# Patient Record
Sex: Male | Born: 1975 | Race: White | Hispanic: No | Marital: Married | Smoking: Never smoker
Health system: Southern US, Community
[De-identification: ages and names within clinical notes are randomized; demographics above are authoritative.]

---

## 2017-03-20 ENCOUNTER — Ambulatory Visit (INDEPENDENT_AMBULATORY_CARE_PROVIDER_SITE_OTHER): Payer: 59

## 2017-03-20 ENCOUNTER — Ambulatory Visit (INDEPENDENT_AMBULATORY_CARE_PROVIDER_SITE_OTHER): Payer: 59 | Admitting: Sports Medicine

## 2017-03-20 DIAGNOSIS — S93331A Other subluxation of right foot, initial encounter: Secondary | ICD-10-CM | POA: Insufficient documentation

## 2017-03-20 DIAGNOSIS — S9304XS Dislocation of right ankle joint, sequela: Secondary | ICD-10-CM

## 2017-03-20 DIAGNOSIS — S9304XA Dislocation of right ankle joint, initial encounter: Secondary | ICD-10-CM | POA: Insufficient documentation

## 2017-03-20 DIAGNOSIS — X501XXS Overexertion from prolonged static or awkward postures, sequela: Secondary | ICD-10-CM | POA: Diagnosis not present

## 2017-03-20 DIAGNOSIS — IMO0001 Reserved for inherently not codable concepts without codable children: Secondary | ICD-10-CM

## 2017-03-20 DIAGNOSIS — M25571 Pain in right ankle and joints of right foot: Secondary | ICD-10-CM

## 2017-03-20 NOTE — Assessment & Plan Note (Signed)
Initial injury occurred 3 months ago, recurrent reinjuries. Peroneal tendon sheath injection, CAM boot. X-rays. We will start with 2 weeks, followed by air cast immobilization, if doing well. If he still has pain after 2 weeks we will do a full month of boot immobilization. After the boot immobilization I will start him on peroneal tendon rehabilitation exercises. Return to see me in 2 weeks.

## 2017-03-20 NOTE — Progress Notes (Signed)
   Subjective:    I'm seeing this patient as a consultation for:  Dr. Simmie Daviesicky Stugart  CC: Chronic right ankle pain  HPI: 3 months ago this pleasant 41 year old male inverted his right ankle, he had immediate pain, swelling, inability to bear weight. He really never got it evaluated, since then he's had persistent ankle instability with occasional popping sensations felt laterally, persistent swelling. Moderate, persistent.  Past medical history, Surgical history, Family history not pertinant except as noted below, Social history, Allergies, and medications have been entered into the medical record, reviewed, and no changes needed.   Review of Systems: No headache, visual changes, nausea, vomiting, diarrhea, constipation, dizziness, abdominal pain, skin rash, fevers, chills, night sweats, weight loss, swollen lymph nodes, body aches, joint swelling, muscle aches, chest pain, shortness of breath, mood changes, visual or auditory hallucinations.   Objective:   General: Well Developed, well nourished, and in no acute distress.  Neuro:  Extra-ocular muscles intact, able to move all 4 extremities, sensation grossly intact.  Deep tendon reflexes tested were normal. Psych: Alert and oriented, mood congruent with affect. ENT:  Ears and nose appear unremarkable.  Hearing grossly normal. Neck: Unremarkable overall appearance, trachea midline.  No visible thyroid enlargement. Eyes: Conjunctivae and lids appear unremarkable.  Pupils equal and round. Skin: Warm and dry, no rashes noted.  Cardiovascular: Pulses palpable, no extremity edema. Right Ankle: Visibly swollen mostly behind the lateral malleolus Range of motion is full in all directions. Strength is 5/5 in all directions. Stable lateral and medial ligaments; squeeze test and kleiger test unremarkable; Talar dome nontender; No pain at base of 5th MT; No tenderness over cuboid; No tenderness over N spot or navicular prominence No tenderness on  posterior aspects of lateral and medial malleolus Palpable peroneal tendon subluxation Negative tarsal tunnel tinel's Able to walk 4 steps.  Procedure: Real-time Ultrasound Guided Injection of right peroneal tendon sheath Device: GE Logiq E  Verbal informed consent obtained.  Time-out conducted.  Noted no overlying erythema, induration, or other signs of local infection.  Skin prepped in a sterile fashion.  Local anesthesia: Topical Ethyl chloride.  With sterile technique and under real time ultrasound guidance:  Noted tearing of the peroneus brevis and severe synovitis of the tendon sheath. 25-gauge needle advanced into the tendon sheath and taking care to avoid intratendinous injection I injected 1 mL kenalog 40, 1 mL lidocaine, 1 mL bupivacaine Completed without difficulty  Pain immediately resolved suggesting accurate placement of the medication.  Advised to call if fevers/chills, erythema, induration, drainage, or persistent bleeding.  Images permanently stored and available for review in the ultrasound unit.  Impression: Technically successful ultrasound guided injection.  Impression and Recommendations:   This case required medical decision making of moderate complexity.  Subluxation of peroneal tendon of right foot Initial injury occurred 3 months ago, recurrent reinjuries. Peroneal tendon sheath injection, CAM boot. X-rays. We will start with 2 weeks, followed by air cast immobilization, if doing well. If he still has pain after 2 weeks we will do a full month of boot immobilization. After the boot immobilization I will start him on peroneal tendon rehabilitation exercises. Return to see me in 2 weeks.  ___________________________________________ Ihor Austinhomas J. Benjamin Stainhekkekandam, M.D., ABFM., CAQSM. Primary Care and Sports Medicine [facility]  Adjunct Instructor of Family Medicine  [employer]

## 2017-03-20 NOTE — Patient Instructions (Addendum)
Noted tearing of the peroneus brevis and severe synovitis of the tendon sheath. 25-gauge needle advanced into the tendon sheath and taking care to avoid intratendinous injection I injected 1 mL kenalog 40, 1 mL lidocaine, 1 mL bupivacaine  Peroneal Tendon Subluxation and Dislocation The peroneal tendons are strong bands of tissue that connect a bone in your foot to the muscles that let you turn your feet outward and stand on your toes (peroneal muscles). The tendons pass under the ankle and into a groove of bone. Peroneal tendon subluxation and dislocation are injuries that happen when the peroneal tendons move out of this groove. The injuries can make your ankle weak and can make your ankle and foot hurt. What are the causes? This condition may be caused by:  A severe ankle sprain.  Wear and tear from repeated ankle stress (overuse).  What increases the risk? This condition is more likely to develop in people who play sports in which there is a risk of spraining an ankle or having the foot be forcefully pushed up and in. These sports include:  Skiing.  Hockey.  Ice Education officer, environmental.  Soccer.  Basketball.  Gymnastics.  What are the signs or symptoms? Symptoms of this condition may start suddenly or develop gradually. Symptoms include:  Pain in the back of the ankle.  A snapping or popping feeling or sound.  Swelling in the ankle.  Bruising in the ankle.  A weak and wobbly ankle or a feeling that your ankle is giving way (ankle instability).  Tenderness in the ankle.  How is this diagnosed? This condition may be diagnosed based on:  Your symptoms.  Your medical history.  A physical exam.  Tests, such as: ? An X-ray or CT scan. These may be done to check your ankle bone. ? MRI or ultrasound. These may be done to check your tendon.  How is this treated? Treatment for a subluxation injury may include:  Keeping your body weight off of your ankle for up to 6  weeks. This treatment involves using a device to help you walk, such as crutches or a walker.  Wearing a boot or cast to support your ankle and keep it still.  Applying ice to the ankle to reduce swelling.  Taking anti-inflammatory pain medicine.  Having medicines injected into your tendon to reduce swelling.  Doing exercises (physical therapy).  Returning gradually to full activity.  Surgery. This may be needed if the injury comes back or does not get better after 3-6 months.  Treatment for a dislocation injury usually involves surgery:  To repair any tears in the tissue that holds your tendons in place (retinaculum).  To create a groove behind the ankle.  To secure the tendons in place with sutures, screws, or pins.  Follow these instructions at home: If you have a boot:  Wear it as told by your health care provider. Remove it only as told by your health care provider.  Loosen the boot if your toes tingle, become numb, or turn cold and blue.  If the boot is not waterproof: ? Do not let it get wet. ? Cover it with a watertight covering when you take a bath or a shower.  Keep the boot clean. If you have a cast:  Do not put pressure on any part of the cast or splint until it is fully hardened. This may take several hours.  Do not stick anything inside the cast to scratch your skin. Doing that increases  your risk of infection.  Check the skin around the cast every day. Tell your health care provider about any concerns.  You may put lotion on dry skin around the edges of the cast. Do not put lotion on the skin underneath the cast.  If the cast is not waterproof: ? Do not let it get wet. ? Cover it with a watertight covering when you take a bath or a shower.  Keep the cast clean. Managing pain, stiffness, and swelling  Take over-the-counter and prescription medicines only as told by your health care provider.  If directed, apply ice to the injured area: ? Put ice  in a plastic bag. ? Place a towel between your skin and the bag. ? Leave the ice on for 20 minutes, 2-3 times a day.  Move your toes often to avoid stiffness and to lessen swelling. Activity  Return to your normal activities as told by your health care provider. Ask your health care provider what activities are safe for you.  Do not use your leg to support (bear) all of your body weight until your health care provider says that you can. Use crutches or a walker as told by your health care provider.  Do not do any activities that make pain or swelling worse.  Do exercises as told by your health care provider. Driving  Do not drive or operate heavy machinery while taking prescription pain medicine.  Ask your health care provider when it is safe to drive if you have a cast or boot on your leg. General instructions  Do not use any tobacco products, such as cigarettes, chewing tobacco, and e-cigarettes. Tobacco can delay bone healing. If you need help quitting, ask your health care provider.  Keep all follow-up visits as told by your health care provider. This is important. How is this prevented?  Wear supportive footwear that is appropriate for your athletic activity.  Avoid athletic activities that cause pain or swelling in your ankle or foot.  See your health care provider if you have pain or swelling that does not improve after a few days of rest.  Stop training if you develop pain or swelling.  If you start a new athletic activity, start gradually so you can build up your strength and flexibility. Contact a health care provider if:  Your symptoms get worse.  Your symptoms do not improve in 2-4 weeks.  You develop new, unexplained symptoms.  Your splint, boot, or cast becomes loose or damaged. This information is not intended to replace advice given to you by your health care provider. Make sure you discuss any questions you have with your health care provider. Document  Released: 06/25/2005 Document Revised: 02/28/2016 Document Reviewed: 05/14/2015 Elsevier Interactive Patient Education  [employer]2018 Elsevier Inc.

## 2017-04-04 ENCOUNTER — Encounter: Payer: Self-pay | Admitting: Sports Medicine

## 2017-04-04 ENCOUNTER — Ambulatory Visit (INDEPENDENT_AMBULATORY_CARE_PROVIDER_SITE_OTHER): Payer: 59 | Admitting: Sports Medicine

## 2017-04-04 DIAGNOSIS — S9304XD Dislocation of right ankle joint, subsequent encounter: Secondary | ICD-10-CM | POA: Diagnosis not present

## 2017-04-04 DIAGNOSIS — IMO0001 Reserved for inherently not codable concepts without codable children: Secondary | ICD-10-CM

## 2017-04-04 DIAGNOSIS — S9304XS Dislocation of right ankle joint, sequela: Secondary | ICD-10-CM

## 2017-04-04 NOTE — Assessment & Plan Note (Signed)
I do think there was some improvement after the peroneal tendon sheath injection, swelling is better. No pain. He does feel as though the ankle is unstable. Strap with compressive dressing, 2 more weeks in the boot, followed by formal physical therapy.

## 2017-04-04 NOTE — Progress Notes (Signed)
  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 He still feels instability, has been in the boot for 2 weeks now.  Past medical history:  Negative.  See flowsheet/record as well for more information.  Surgical history: Negative.  See flowsheet/record as well for more information.  Family history: Negative.  See flowsheet/record as well for more information.  Social history: Negative.  See flowsheet/record as well for more information.  Allergies, and medications have been entered into the medical record, reviewed, and no changes needed.   Review of Systems: No fevers, chills, night sweats, weight loss, chest pain, or shortness of breath.   Objective:    General: Well Developed, well nourished, and in no acute distress.  Neuro: Alert and oriented x3, extra-ocular muscles intact, sensation grossly intact.  HEENT: Normocephalic, atraumatic, pupils equal round reactive to light, neck supple, no masses, no lymphadenopathy, thyroid nonpalpable.  Skin: Warm and dry, no rashes. Cardiac: Regular rate and rhythm, no murmurs rubs or gallops, no lower extremity edema.  Respiratory: Clear to auscultation bilaterally. Not using accessory muscles, speaking in full sentences. Right Ankle: No visible erythema or swelling. Range of motion is full in all directions. Strength is 5/5 in all directions. Stable lateral and medial ligaments; squeeze test and kleiger test unremarkable; Talar dome nontender; No pain at base of 5th MT; No tenderness over cuboid; No tenderness over N spot or navicular prominence No tenderness on posterior aspects of lateral and  medial malleolus No sign of peroneal tendon subluxations; he is tender over his peroneus longus just proximal to the base of the fifth metatarsal Negative tarsal tunnel tinel's Able to walk 4 steps.  Strap with compressive dressing  Impression and Recommendations:    Subluxation of peroneal tendon of right foot I do think there was some improvement after the peroneal tendon sheath injection, swelling is better. No pain. He does feel as though the ankle is unstable. Strap with compressive dressing, 2 more weeks in the boot, followed by formal physical therapy.  ___________________________________________ Ihor Austin. Benjamin Stain, M.D., ABFM., CAQSM. Primary Care and Sports Medicine [facility]  Adjunct Instructor of Family Medicine  [employer]

## 2017-04-22 ENCOUNTER — Encounter: Payer: Self-pay | Admitting: Rehabilitation

## 2017-04-22 ENCOUNTER — Encounter: Payer: Self-pay | Admitting: Sports Medicine

## 2017-04-22 ENCOUNTER — Ambulatory Visit (INDEPENDENT_AMBULATORY_CARE_PROVIDER_SITE_OTHER): Payer: 59 | Admitting: Rehabilitation

## 2017-04-22 ENCOUNTER — Encounter (INDEPENDENT_AMBULATORY_CARE_PROVIDER_SITE_OTHER): Payer: Self-pay

## 2017-04-22 DIAGNOSIS — R600 Localized edema: Secondary | ICD-10-CM

## 2017-04-22 DIAGNOSIS — M25571 Pain in right ankle and joints of right foot: Secondary | ICD-10-CM | POA: Diagnosis not present

## 2017-04-22 DIAGNOSIS — R2689 Other abnormalities of gait and mobility: Secondary | ICD-10-CM

## 2017-04-22 NOTE — Therapy (Signed)
[facility] [place] [address] [place], [place], [address] Phone: [phone]   Fax:  [phone]  Physical Therapy Evaluation  Patient Details  Name: Manuel Young MRN: [id number] Date of Birth: 1975-09-05 Referring Provider: Dr. Jerolyn Shin  Encounter Date: 04/22/2017      PT End of Session - 04/22/17 1020    PT Start Time 0845   PT Stop Time 0930   PT Time Calculation (min) 45 min      History reviewed. No pertinent past medical history.  History reviewed. No pertinent surgical history.  There were no vitals filed for this visit.       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  Pt was recently doing better after using a walking boot and receiving an US guided injection by Dr. Karie Schwalbe about 2 weeks ago.  He arrives today reporting he did something dumb yesterday and jumped off of a structure at a concert and heard another pop and with new swelling and bruising present.  Has not called the MD yet.  Pt reports that prior to this injury the ankle was not painful but felt very weak and had recurrent rolling especially with single leg activities.     Limitations Walking   Diagnostic tests none yet   Patient Stated Goals improve ankle strength and stability, prevent surgery    Currently in Pain? Yes   Pain Score 2    Pain Location Ankle   Pain Orientation Right   Pain Descriptors / Indicators Aching   Pain Onset Yesterday   Pain Frequency Constant   Aggravating Factors  movement up and out, walking   Pain Relieving Factors ice   Effect of Pain on Daily Activities limited walking due to boot and pain            [facility] PT Assessment - 04/22/17 0001      Assessment   Medical Diagnosis R peroneal tendon subluxation and recent ankle sprain   Referring Provider Dr. 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   Leisure hockey, golf     Observation/Other Assessments   Focus on Therapeutic Outcomes (FOTO)  46%     ROM / Strength   AROM / PROM / Strength AROM;Strength     AROM   Overall AROM Comments slight pain with all motions at lateral malleolus   AROM Assessment Site Ankle   Right/Left Ankle Right   Right Ankle Dorsiflexion 10   Right Ankle Plantar Flexion 35   Right Ankle Inversion 40   Right Ankle Eversion 15     Strength   Overall Strength Comments 4+/5 all motions R ankle      Palpation   Palpation comment 1 ttp distal to lateral malleolus, general edema lateral ankle and over TC  joint, bruising near lat mall     Special Tests    Special Tests Ankle/Foot Special Tests   Swelling Tests other     other   Side Right   Comments fig. 8 measurement of 67cm     Ambulation/Gait   Gait Comments walking in walking boot R foot with some antalgia with weightbearing            Objective measurements completed on examination: See above findings.          [facility] Adult PT Treatment/Exercise - 04/22/17 0001      Exercises   Exercises Ankle     Modalities   Modalities Vasopneumatic     Vasopneumatic   Number Minutes Vasopneumatic  15 minutes   Vasopnuematic Location  Ankle   Vasopneumatic Pressure High   Vasopneumatic Temperature  coldest  elevated leg     Ankle Exercises: Seated   Other Seated Ankle Exercises HEP education and performance of ankle  alphabet, and seated DF/PF                PT Education - 04/22/17 0926    Education provided Yes   Education Details RICE, getting in touch with MD about new injury, ankle AROM   Person(s) Educated Patient   Methods Explanation;Handout;Verbal cues   Comprehension Verbalized understanding             PT Long Term Goals - 04/22/17 1028      PT LONG TERM GOAL #1   Title Pt will decrease R ankle edema by 25%   Baseline 67cm fig 8   Time 5   Period Weeks   Status New   Target Date 05/27/17     PT LONG TERM GOAL #2   Title Pt will decrease FOTO to 25% or less   Baseline 46%   Time 5   Period Weeks   Status New   Target Date 05/27/17     PT LONG TERM GOAL #3   Title Pt will return to ambulation without the boot without signficant deviations   Time 5   Period Weeks   Status New   Target Date 05/27/17     PT LONG TERM GOAL #4   Title Pt will be independent with HEP for continued ankle strength and stability   Time 5   Period Weeks   Status New   Target Date 05/27/17                Plan - 04/22/17 1021    Clinical Impression Statement Pt presents with a referral for Rt 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  Pt was feeling minimal pain overall and mostly instability before this weekend prior to coming into therapy but over this weekend patient was at a concert where he jumped off of a structure only wearing his ace bandage and heard another pop with new onset of swelling and bruising.  Pt will be contacting the MD today regarding further instruction but he scheduled PT sessions today assuming we will go ahead as instructed.  Minimal assessment today due to new injury.  Deficits include decreased ankle ROM, edema, pain, and chronic instability.     Clinical Presentation Evolving   Clinical Presentation due to: new injury   Clinical Decision Making Low   Rehab Potential Excellent    PT Frequency 2x / week   PT Duration --  5 weeks  PT Treatment/Interventions Electrical Stimulation;Ultrasound;Patient/family education;Balance training;Neuromuscular re-education;Therapeutic exercise;Therapeutic activities;Manual techniques;Passive range of motion;Vasopneumatic Device;Taping;Dry needling   PT Next Visit Plan see if any new directions from MD, edema reduction strategies, begin AROM/isometrics   PT Home Exercise Plan given ankle AROM 10/15      Patient will benefit from skilled therapeutic intervention in order to improve the following deficits and impairments:  Abnormal gait, Pain, Increased edema, Decreased mobility, Decreased strength, Difficulty walking  Visit Diagnosis: Pain in right ankle and joints of right foot - Plan: PT plan of care cert/re-cert  Localized edema - Plan: PT plan of care cert/re-cert  Other abnormalities of gait and mobility - Plan: PT plan of care cert/re-cert     Problem List Patient Active Problem List   Diagnosis Date Noted  . Subluxation of peroneal tendon of right foot 03/20/2017    Idamae Lusher, DPT, CMP 04/22/2017, 10:52 AM  [facility] [address] [place] [address] [place], [place], [address] Phone: [phone]   Fax:  [phone]  Name: Manuel Young MRN: [id number] Date of Birth: 1975/09/19

## 2017-04-29 ENCOUNTER — Ambulatory Visit (INDEPENDENT_AMBULATORY_CARE_PROVIDER_SITE_OTHER): Payer: 59 | Admitting: Physical Therapy

## 2017-04-29 DIAGNOSIS — M25571 Pain in right ankle and joints of right foot: Secondary | ICD-10-CM

## 2017-04-29 DIAGNOSIS — R2689 Other abnormalities of gait and mobility: Secondary | ICD-10-CM | POA: Diagnosis not present

## 2017-04-29 DIAGNOSIS — R600 Localized edema: Secondary | ICD-10-CM

## 2017-04-29 NOTE — Patient Instructions (Signed)
Plantar Flexion: Isometric   Press left foot into ball or rolled pillow against wall. Hold _5-10___ seconds. Relax. Repeat ___10_ times per set. Do _1___ sets per session. Do __3__ sessions per day.  [email or web address]://[email or web address]   Inversion: Isometric   Press inner borders of feet into ball or rolled pillow between feet. Hold __5-10__ seconds. Relax. Repeat __10__ times per set. Do __1__ sets per session. Do _3___ sessions per day.  [email or web address]://[email or web address]   Pillow Squeeze (Isometric Dorsiflexion)   Lying with pillow between feet, one foot on top, squeeze feet together, bringing bottom foot up while pushing top one down. Hold _5-10___ seconds. Repeat with other foot on top. Repeat __10__ times. Do _3___ sessions per day.  [email or web address]   Eversion: Isometric   Press outer border of right foot into ball or rolled pillow against wall. Hold ___5-10_ seconds. Relax. Repeat _10___ times per set. Do _1___ sets per session. Do _3___ sessions per day.  Soleus Stretch - Standing    Stand with uninvolved leg behind, slightly bent, heel on floor. Lean into wall until stretch is felt in calf. Hold for _30__ seconds. Repeat on involved leg. Repeat __2-3_ times. Do __2_ times per day.  Calf Stretch    Place hands on wall at shoulder height. Keeping back leg straight, bend front leg, feet pointing forward, heels flat on floor. Lean forward slightly until stretch is felt in calf of back leg. Hold stretch _30__ seconds, breathing slowly in and out. Repeat stretch with other leg back. 2-3 reps.  Do _2__ sessions per day. Variation: Use chair or table for support.  [facility] [address] [place], [place] [address]  [phone] (office) [phone] (fax)

## 2017-04-29 NOTE — Therapy (Signed)
[facility] [place] [address] [place], [place], [address] Phone: [phone]   Fax:  [phone]  Physical Therapy Treatment  Patient Details  Name: Manuel Young MRN: [id number] Date of Birth: 12-17-1975 Referring Provider: Dr. Benjamin Stain  Encounter Date: 04/29/2017      PT End of Session - 04/29/17 0814    Visit Number 2   Number of Visits 10   Date for PT Re-Evaluation 05/27/17   Authorization Type UHC   Authorization - Number of Visits 20   PT Start Time 0804   PT Stop Time 0858   PT Time Calculation (min) 54 min   Activity Tolerance Patient tolerated treatment well   Behavior During Therapy [facility] for tasks assessed/performed      No past medical history on file.  No past surgical history on file.  There were no vitals filed for this visit.      Subjective Assessment - 04/29/17 0807    Subjective Pt has been wearing boot during day since concert 1 wk ago. He feels the swelling has gone down some. He has been doing HEP, but not icing.    Currently in Pain? Yes   Pain Score 3    Pain Location Ankle   Pain Orientation Right   Aggravating Factors  movement up and out    Pain Relieving Factors rest, time            [facility] PT Assessment - 04/29/17 0001      Assessment   Medical Diagnosis R peroneal tendon subluxation and recent ankle sprain   Referring Provider Dr. Benjamin Stain   Onset Date/Surgical Date 07/09/98   Next MD Visit 05/02/17     AROM   Right Ankle Dorsiflexion 12   Right Ankle Plantar Flexion 39   Right Ankle Inversion 40   Right Ankle Eversion 25          [facility] Adult PT Treatment/Exercise - 04/29/17 0001      Modalities   Modalities Electrical Stimulation;Ultrasound     Insurance claims handler Stimulation Location Rt ankle    Electrical Stimulation Action ion repelling    Electrical Stimulation Parameters to tolerance    Electrical Stimulation Goals Edema     Vasopneumatic   Number Minutes Vasopneumatic  15 minutes   Vasopnuematic Location  Ankle   Vasopneumatic Pressure High   Vasopneumatic Temperature  34     Manual Therapy   Manual Therapy Taping   Kinesiotex Edema     Kinesiotix   Edema 2 - thin I strips applied to Rt lateral ankle/foot over peroneus brevis with 15% stretch. (Waterproof green rock tape)     Ankle Exercises: Seated   BAPS Sitting;Level 3  PF/DF, inv/eversion, CW/CCW   Other Seated Ankle Exercises Rt ankle isometrics of Pf/DF/inversion/eversion x 10 sec x 10 reps each direction      Ankle Exercises: Stretches   Soleus Stretch 2 reps;30 seconds   Gastroc Stretch 2 reps;60 seconds     Ankle Exercises: Aerobic   Stationary Bike L3: 5 min                 PT Education - 04/29/17 1315    Education provided Yes   Education Details [employer], HEP    Person(s) Educated Patient   Methods Explanation;Handout;Verbal cues;Demonstration   Comprehension Verbalized understanding;Returned demonstration             PT Long Term Goals - 04/22/17 1028  PT LONG TERM GOAL #1   Title Pt will decrease R ankle edema by 25%   Baseline 67cm fig 8   Time 5   Period Weeks   Status New   Target Date 05/27/17     PT LONG TERM GOAL #2   Title Pt will decrease FOTO to 25% or less   Baseline 46%   Time 5   Period Weeks   Status New   Target Date 05/27/17     PT LONG TERM GOAL #3   Title Pt will return to ambulation without the boot without signficant deviations   Time 5   Period Weeks   Status New   Target Date 05/27/17     PT LONG TERM GOAL #4   Title Pt will be independent with HEP for continued ankle strength and stability   Time 5   Period Weeks   Status New   Target Date 05/27/17               Plan - 04/29/17 [id number]    Clinical Impression Statement Pt demonstrated improved Rt ankle ROM.  He had some increase in discomfort with AROM/isometrics of eversion/inversion.  Otherwise pt  tolerated exercises well.  Trial of Rock tape applied to assist in edema/bruising reduction.  Pt progressing towards goals.    Rehab Potential Excellent   PT Frequency 2x / week   PT Duration --  5 wks.    PT Treatment/Interventions Electrical Stimulation;Ultrasound;Patient/family education;Balance training;Neuromuscular re-education;Therapeutic exercise;Therapeutic activities;Manual techniques;Passive range of motion;Vasopneumatic Device;Taping;Dry needling   PT Next Visit Plan Progress Rt ankle ROM/strengthening, modalities for edema reduction, assess respose to Rock tape.    Consulted and Agree with Plan of Care Patient      Patient will benefit from skilled therapeutic intervention in order to improve the following deficits and impairments:  Abnormal gait, Pain, Increased edema, Decreased mobility, Decreased strength, Difficulty walking  Visit Diagnosis: Pain in right ankle and joints of right foot  Localized edema  Other abnormalities of gait and mobility     Problem List Patient Active Problem List   Diagnosis Date Noted  . Subluxation of peroneal tendon of right foot 03/20/2017   Mayer CamelJennifer Carlson-Long, PTA 04/29/17 1:17 PM  [facility] [address] [place], [place], [address] Phone: [phone]   Fax:  [phone]  Name: Manuel Young MRN: [id number] Date of Birth: 1975-12-29

## 2017-05-02 ENCOUNTER — Ambulatory Visit (INDEPENDENT_AMBULATORY_CARE_PROVIDER_SITE_OTHER): Payer: 59 | Admitting: Sports Medicine

## 2017-05-02 ENCOUNTER — Encounter: Payer: 59 | Admitting: Physical Therapy

## 2017-05-02 DIAGNOSIS — IMO0001 Reserved for inherently not codable concepts without codable children: Secondary | ICD-10-CM

## 2017-05-02 DIAGNOSIS — S9304XS Dislocation of right ankle joint, sequela: Secondary | ICD-10-CM

## 2017-05-02 DIAGNOSIS — L918 Other hypertrophic disorders of the skin: Secondary | ICD-10-CM | POA: Diagnosis not present

## 2017-05-02 NOTE — Assessment & Plan Note (Signed)
Overall doing well, he did have a single episode of an inversion, reinjured, went back into the boot and is doing well now. Adding to Aircast stirrup, and continue formal physical therapy. Return in 1 month. Physical therapy with kinesiotaping seems to be fantastically efficacious.
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
## 2017-05-02 NOTE — Assessment & Plan Note (Signed)
On the neck, cryotherapy x1

## 2017-05-06 ENCOUNTER — Ambulatory Visit (INDEPENDENT_AMBULATORY_CARE_PROVIDER_SITE_OTHER): Payer: 59 | Admitting: Physical Therapy

## 2017-05-06 DIAGNOSIS — R600 Localized edema: Secondary | ICD-10-CM

## 2017-05-06 DIAGNOSIS — R2689 Other abnormalities of gait and mobility: Secondary | ICD-10-CM | POA: Diagnosis not present

## 2017-05-06 DIAGNOSIS — M25571 Pain in right ankle and joints of right foot: Secondary | ICD-10-CM | POA: Diagnosis not present

## 2017-05-06 NOTE — Patient Instructions (Signed)
Inversion: Resisted   Cross legs with right leg underneath, foot in tubing loop. Hold tubing around other foot to resist and turn foot IN. Repeat _10___ times per set. Do __2-3__ sets per session. Do __1__ sessions per day.  Eversion: Resisted   With right foot in tubing loop, hold tubing around other foot to resist and turn foot out. Repeat _10__ times per set. Do __2-3 sets per session. Do __1__ sessions per day.  Dorsiflexion: Resisted   Facing anchor, tubing around left foot, pull toward face.  Repeat _10___ times per set. Do __2-3__ sets per session. Do __1__ sessions per day.  HIP: Abduction - Side-Lying    Lie on side, legs straight and in line with trunk. Squeeze glutes. Raise top leg up and slightly back. Point toes forward. __10_ reps per set, __2-3-_ sets per day, _5__ days per week Bend bottom leg to stabilize pelvis.  Over / Back: "Hot Potato"    Lie on side, back straight along edge of mat, legs 30 in front of torso. Touch toes of top foot in front of bottom leg, then quickly touch in back. Repeat __10-30__ times. Repeat on other side. Do ___1_ sessions per day.

## 2017-05-06 NOTE — Therapy (Signed)
[place], [place], [address] Phone: [phone]   Fax:  [phone]  Physical Therapy Treatment  Patient Details  Name: Manuel Young MRN: [id number] Date of Birth: 1976/01/15 Referring Provider: Dr. Dianah Field  Encounter Date: 05/06/2017      PT End of Session - 05/06/17 0808    Visit Number 3   Number of Visits 10   Date for PT Re-Evaluation 05/27/17   PT Start Time 0806   PT Stop Time 0902   PT Time Calculation (min) 56 min      No past medical history on file.  No past surgical history on file.  There were no vitals filed for this visit.      Subjective Assessment - 05/06/17 0809    Subjective Pt reports he is feeling much better since last visit.  He is no longer in boot, only wearing aircast during waking hours. He has been doing his HEP daily.    Currently in Pain? No/denies   Pain Score 0-No pain            [facility] PT Assessment - 05/06/17 0001      Assessment   Medical Diagnosis R peroneal tendon subluxation and recent ankle sprain   Referring Provider Dr. Dianah Field   Onset Date/Surgical Date 07/09/98     other   Comments fig. 8 measurement of 57 cm           [facility] Adult PT Treatment/Exercise - 05/06/17 0001      Vasopneumatic   Number Minutes Vasopneumatic  15 minutes   Vasopnuematic Location  Ankle   Vasopneumatic Pressure Medium   Vasopneumatic Temperature  34     Kinesiotix   Edema 2 - thin I strips applied to Rt lateral ankle/foot over peroneus brevis with 15% stretch. (Waterproof green rock tape)     Ankle Exercises: Aerobic   Stationary Bike L3: 6 min      Ankle Exercises: Stretches   Soleus Stretch 2 reps;30 seconds   Gastroc Stretch 2 reps;60 seconds     Ankle Exercises: Seated   BAPS --   Other Seated Ankle Exercises Rt ankle band exercises:  eversion, inversion, DF x 10 reps, 2 sets with red band.      Ankle Exercises: Standing   BAPS  Standing;Level 3;10 reps  PF/DF x 12 reps, circles x 5 reps CW/CCW (UE support)   Heel Raises 10 reps  BLE   Other Standing Ankle Exercises Rt hip abd x 10 reps, hot potato x 10 reps; side plank x 5 sec, then side plank with hip dips x 5 each side                PT Education - 05/06/17 0839    Education provided Yes   Education Details HEP   Person(s) Educated Patient   Methods Explanation   Comprehension Verbalized understanding             PT Long Term Goals - 05/06/17 0916      PT LONG TERM GOAL #1   Title Pt will decrease R ankle edema by 25%   Time 5   Period Weeks   Status Achieved     PT LONG TERM GOAL #2   Title Pt will decrease FOTO to 25% or less   Baseline 46%   Period Weeks   Status On-going     PT LONG TERM GOAL #3   Title Pt will return to ambulation  without the boot without signficant deviations   Period Weeks   Status On-going     PT LONG TERM GOAL #4   Title Pt will be independent with HEP for continued ankle strength and stability   Time 5   Period Weeks   Status On-going               Plan - 05/06/17 0857    Clinical Impression Statement Improved swelling resolution in Rt ankle.  Strength in Rt ankle has improved as well. Pt tolerated band exercises and standing BAPS without increase in pain/symptoms. Pt has met LTG#1 and is making good progress towards remaining goals.    Rehab Potential Excellent   PT Frequency 2x / week   PT Duration --  5 wks   PT Treatment/Interventions Electrical Stimulation;Ultrasound;Patient/family education;Balance training;Neuromuscular re-education;Therapeutic exercise;Therapeutic activities;Manual techniques;Passive range of motion;Vasopneumatic Device;Taping;Dry needling   PT Next Visit Plan Progress Rt ankle ROM/strengthening, modalities for edema reduction.   Consulted and Agree with Plan of Care Patient      Patient will benefit from skilled therapeutic intervention in order to improve the  following deficits and impairments:  Abnormal gait, Pain, Increased edema, Decreased mobility, Decreased strength, Difficulty walking  Visit Diagnosis: Pain in right ankle and joints of right foot  Localized edema  Other abnormalities of gait and mobility     Problem List Patient Active Problem List   Diagnosis Date Noted  . Skin tag 05/02/2017  . Subluxation of peroneal tendon of right foot 03/20/2017   Kerin Perna, PTA 05/06/17 9:17 AM  [facility] [place], [place], [address] Phone: [phone]   Fax:  [phone]  Name: Doye Montilla MRN: [id number] Date of Birth: 18-Mar-1976

## 2017-05-09 ENCOUNTER — Ambulatory Visit (INDEPENDENT_AMBULATORY_CARE_PROVIDER_SITE_OTHER): Payer: 59 | Admitting: Physical Therapy

## 2017-05-09 DIAGNOSIS — M25571 Pain in right ankle and joints of right foot: Secondary | ICD-10-CM | POA: Diagnosis not present

## 2017-05-09 DIAGNOSIS — R2689 Other abnormalities of gait and mobility: Secondary | ICD-10-CM | POA: Diagnosis not present

## 2017-05-09 DIAGNOSIS — R600 Localized edema: Secondary | ICD-10-CM | POA: Diagnosis not present

## 2017-05-09 NOTE — Therapy (Signed)
[facility] [address] [place], [place], [address] Phone: [phone]   Fax:  [phone]  Physical Therapy Treatment  Patient Details  Name: Manuel Young MRN: [id number] Date of Birth: 1975-12-11 Referring Provider: Dr. Benjamin Stainhekkekandam  Encounter Date: 05/09/2017      PT End of Session - 05/09/17 0810    Visit Number 4   Number of Visits 10   Date for PT Re-Evaluation 05/27/17   Authorization Type UHC   PT Start Time 0800   PT Stop Time 0900   PT Time Calculation (min) 60 min   Activity Tolerance Patient tolerated treatment well   Behavior During Therapy [facility] for tasks assessed/performed      No past medical history on file.  No past surgical history on file.  There were no vitals filed for this visit.      Subjective Assessment - 05/09/17 0811    Subjective Pt reports he felt a twinge pain when doing band exercises (PF with inversion) but it only lasted a moment. He golfed yesterday and took kids trick or treating (with aircast on); this went fine.     Currently in Pain? Yes   Pain Score 1    Pain Location Ankle   Pain Orientation Right   Pain Descriptors / Indicators Sore   Aggravating Factors  inversion ankle movement   Pain Relieving Factors rest, time.             [facility] - 05/09/17 0001      Assessment   Medical Diagnosis R peroneal tendon subluxation and recent ankle sprain   Referring Provider Dr. Benjamin Stainhekkekandam   Onset Date/Surgical Date 07/09/98   Next MD Visit 05/29/17           [facility] - 05/09/17 0001      Modalities   Modalities Cryotherapy;Electrical Stimulation     Cryotherapy   Number Minutes Cryotherapy 15 Minutes   Cryotherapy Location Ankle  Rt   Type of Cryotherapy Ice pack     Electrical Stimulation   Electrical Stimulation Location Rt ankle    Electrical Stimulation Action ion repelling    Electrical Stimulation Parameters to  tolerance    Electrical Stimulation Goals Edema;Pain     Kinesiotix   Edema tape still in place from last visit; no new tape applied.       Ankle Exercises: Standing   BAPS Standing;Level 3;10 reps  PF/DF x 12 reps, circles x 5 reps CW/CCW (UE support)   SLS Rt SLS on blue pad x 30 sec, then with 2# ball toss to tramp x 30 sec, repeated with vectors.  Single leg deadlifts x 10 reps each leg ( to top of trashcan)    Heel Raises --   Other Standing Ankle Exercises side stepping with blue band at thighs x 10 ft Rt/Lt x 2 reps;  curtsy lunges x 10 reps each leg.  Fitter with UE support, 1 black/1 blue band x 30 strides.      Ankle Exercises: Aerobic   Stationary Bike L3: 5 min      Ankle Exercises: Stretches   Soleus Stretch 2 reps;30 seconds   Gastroc Stretch 2 reps;60 seconds                PT Education - 05/09/17 0846    Education provided Yes   Education Details HEP   Person(s) Educated Patient   Methods Explanation;Demonstration;Handout;Verbal cues   Comprehension Returned demonstration;Verbalized understanding  PT Long Term Goals - 05/06/17 0916      PT LONG TERM GOAL #1   Title Pt will decrease R ankle edema by 25%   Time 5   Period Weeks   Status Achieved     PT LONG TERM GOAL #2   Title Pt will decrease FOTO to 25% or less   Baseline 46%   Period Weeks   Status On-going     PT LONG TERM GOAL #3   Title Pt will return to ambulation without the boot without signficant deviations   Period Weeks   Status On-going     PT LONG TERM GOAL #4   Title Pt will be independent with HEP for continued ankle strength and stability   Time 5   Period Weeks   Status On-going               Plan - 05/09/17 0853    Clinical Impression Statement Focus today was on strengthening Rt LE chain.  He had difficulty with SLS exercises on Rt compared to Lt, demonstrating decreased balance/proprioception. Issued new HEP exercises to address this.    Tolerated all exercises with minimal increase in pain.  Pt is progressing well towards established goals.    Rehab Potential Excellent   PT Frequency 2x / week   PT Duration --  5 wks   PT Treatment/Interventions Electrical Stimulation;Ultrasound;Patient/family education;Balance training;Neuromuscular re-education;Therapeutic exercise;Therapeutic activities;Manual techniques;Passive range of motion;Vasopneumatic Device;Taping;Dry needling   PT Next Visit Plan Progress RLE ROM/strengthening. Progress HEP as tolerated.     Consulted and Agree with Plan of Care Patient      Patient will benefit from skilled therapeutic intervention in order to improve the following deficits and impairments:  Abnormal gait, Pain, Increased edema, Decreased mobility, Decreased strength, Difficulty walking  Visit Diagnosis: Pain in right ankle and joints of right foot  Localized edema  Other abnormalities of gait and mobility     Problem List Patient Active Problem List   Diagnosis Date Noted  . Skin tag 05/02/2017  . Subluxation of peroneal tendon of right foot 03/20/2017   Mayer Camel, PTA 05/09/17 8:57 AM  [facility] [address] [place], [place], [address] Phone: [phone]   Fax:  [phone]  Name: Manuel Young MRN: [id number] Date of Birth: 1976-06-04

## 2017-05-09 NOTE — Patient Instructions (Addendum)
Balance: Unilateral - Foam    Eyes open, balance with right leg on dense foam. Hold _30___ seconds. Repeat _2__ times per set.   Balance: Unilateral - Forward Lean    Stand on left foot, hands on hips. Keeping hips level, bend forward as if to touch forehead to wall. Hold _2___ seconds. Relax. Repeat _5-10___ times per set. Do _2___ sets per session. Do _3___ sessions per week  Side Step Abduction With Exercise Band    Feet turned out slightly, squat. Side step _10__ feet to each side. Do _3-4__ sets.    [facility] [address] [place], [place] [address]  [phone] (office) [phone] (fax)

## 2017-05-13 ENCOUNTER — Ambulatory Visit (INDEPENDENT_AMBULATORY_CARE_PROVIDER_SITE_OTHER): Payer: 59 | Admitting: Physical Therapy

## 2017-05-13 ENCOUNTER — Encounter: Payer: Self-pay | Admitting: Physical Therapy

## 2017-05-13 DIAGNOSIS — M25571 Pain in right ankle and joints of right foot: Secondary | ICD-10-CM

## 2017-05-13 DIAGNOSIS — R600 Localized edema: Secondary | ICD-10-CM

## 2017-05-13 DIAGNOSIS — R2689 Other abnormalities of gait and mobility: Secondary | ICD-10-CM | POA: Diagnosis not present

## 2017-05-13 NOTE — Therapy (Signed)
[facility] [place] [address] [place], [place], [address] Phone: [phone]   Fax:  [phone]  Physical Therapy Treatment  Patient Details  Name: Manuel Young MRN: [id number] Date of Birth: 1976/03/10 Referring Provider: Dr. Benjamin Stain   Encounter Date: 05/13/2017  PT End of Session - 05/13/17 0807    Visit Number  5    Number of Visits  10    Date for PT Re-Evaluation  05/27/17    PT Start Time  0804    PT Stop Time  0901 ice pack last 12 min    ice pack last 12 min    PT Time Calculation (min)  57 min    Activity Tolerance  Patient tolerated treatment well    Behavior During Therapy  [facility] for tasks assessed/performed       History reviewed. No pertinent past medical history.  History reviewed. No pertinent surgical history.  There were no vitals filed for this visit.  Subjective Assessment - 05/13/17 0808    Subjective  Pt reports he was surprised how sore he was after doing the lunges last week.  He still occasionally gets a twinge in his Rt ankle.  He feels he is getting more strength and a little more confidence in his right ankle.    Patient Stated Goals  improve ankle strength and stability, prevent surgery     Currently in Pain?  No/denies    Pain Score  0-No pain         [facility] PT Assessment - 05/13/17 0001      Assessment   Medical Diagnosis  R peroneal tendon subluxation and recent ankle sprain    Referring Provider  Dr. Benjamin Stain    Onset Date/Surgical Date  07/09/98    Next MD Visit  05/29/17      Palpation   Palpation comment  pt point tender below Rt lateral malleoli with palpation.            [facility] Adult PT Treatment/Exercise - 05/13/17 0001      Cryotherapy   Number Minutes Cryotherapy  12 Minutes    Cryotherapy Location  Ankle Rt   Rt   Type of Cryotherapy  Ice pack      Kinesiotix   Edema  2 - thin I strips applied to Rt lateral ankle/foot over peroneus brevis with 15%  stretch. (Waterproof green rock tape). Tape applied to increase proprioception/ facilitate muscle and decrease any residual swelling.       Ankle Exercises: Standing   SLS  Rt SLS on upside down Bosu x 30 sec, repeated with horiz head turns    Heel Raises  20 reps heels off of step, eccentric lowering   heels off of step, eccentric lowering   Heel Walk (Round Trip)  20 ft     Toe Walk (Round Trip)  20 Ft    Other Standing Ankle Exercises  Rt forward step ups onto Bosu with 2 sec pause x 10 reps; repeated with lateral step ups. Fitter with 2 black bands x 40 strides side/side.  Curtsy lunges with 5# in each hand x 10, 2 sets     Other Standing Ankle Exercises  walking lunges front/ side x 40 ft x 2 reps, with 5# in each hand.       Ankle Exercises: Aerobic   Elliptical  L3: 4 min       Ankle Exercises: Stretches   Soleus Stretch  2 reps;30 seconds  Gastroc Stretch  60 seconds;3 reps    Other Stretch  slow stretch into sitting on bilat heels (some discomfort on Rt ankle) x 10 sec x 5 reps     Other Stretch  Quad stretch R/L 15 sec x 2 reps each      Ankle Exercises: Seated   Towel Inversion/Eversion  4 reps Rt foot, 3# on towel   Rt foot, 3# on towel   Other Seated Ankle Exercises  Rt ankle eversion with green band x 12 reps                   PT Long Term Goals - 05/06/17 0916      PT LONG TERM GOAL #1   Title  Pt will decrease R ankle edema by 25%    Time  5    Period  Weeks    Status  Achieved      PT LONG TERM GOAL #2   Title  Pt will decrease FOTO to 25% or less    Baseline  46%    Period  Weeks    Status  On-going      PT LONG TERM GOAL #3   Title  Pt will return to ambulation without the boot without signficant deviations    Period  Weeks    Status  On-going      PT LONG TERM GOAL #4   Title  Pt will be independent with HEP for continued ankle strength and stability    Time  5    Period  Weeks    Status  On-going            Plan - 05/13/17  [id number]    Clinical Impression Statement  Improved balance with RLE single leg exercises, with only occasional "twinges" in Rt ankle with exercise.  Pt able to tolerate increased resistance without increase in symptoms.  Pt progressing well towards remaining goals.  Pt interested in returning to ice hockey this fall.      Rehab Potential  Excellent    PT Frequency  2x / week    PT Duration  -- 5 wks   5 wks   PT Treatment/Interventions  Electrical Stimulation;Ultrasound;Patient/family education;Balance training;Neuromuscular re-education;Therapeutic exercise;Therapeutic activities;Manual techniques;Passive range of motion;Vasopneumatic Device;Taping;Dry needling    PT Next Visit Plan  Progress RLE ROM/strengthening. Progress HEP as tolerated.      Consulted and Agree with Plan of Care  Patient       Patient will benefit from skilled therapeutic intervention in order to improve the following deficits and impairments:     Visit Diagnosis: Pain in right ankle and joints of right foot  Localized edema  Other abnormalities of gait and mobility     Problem List Patient Active Problem List   Diagnosis Date Noted  . Skin tag 05/02/2017  . Subluxation of peroneal tendon of right foot 03/20/2017   Mayer CamelJennifer Carlson-Long, PTA 05/13/17 9:48 AM  [facility] [address] [place], [place], [address] Phone: [phone]   Fax:  [phone]  Name: Manuel Young MRN: [id number] Date of Birth: 06-Jul-1976

## 2017-05-16 ENCOUNTER — Encounter: Payer: 59 | Admitting: Physical Therapy

## 2017-05-17 ENCOUNTER — Ambulatory Visit (INDEPENDENT_AMBULATORY_CARE_PROVIDER_SITE_OTHER): Payer: 59 | Admitting: Physical Therapy

## 2017-05-17 ENCOUNTER — Encounter: Payer: Self-pay | Admitting: Physical Therapy

## 2017-05-17 DIAGNOSIS — R2689 Other abnormalities of gait and mobility: Secondary | ICD-10-CM | POA: Diagnosis not present

## 2017-05-17 DIAGNOSIS — R600 Localized edema: Secondary | ICD-10-CM

## 2017-05-17 DIAGNOSIS — M25571 Pain in right ankle and joints of right foot: Secondary | ICD-10-CM

## 2017-05-17 NOTE — Therapy (Signed)
[facility] [address] [place], [place], [address] Phone: [phone]   Fax:  [phone]  Physical Therapy Treatment  Patient Details  Name: Manuel Young MRN: [id number] Date of Birth: Jun 18, 1976 Referring Provider: Dr. Benjamin Stainhekkekandam   Encounter Date: 05/17/2017  PT End of Session - 05/17/17 0810    Visit Number  6    Number of Visits  10    Date for PT Re-Evaluation  05/27/17    Authorization - Number of Visits  20    PT Start Time  0802    PT Stop Time  0859 ice pack last 12 min    PT Time Calculation (min)  57 min    Behavior During Therapy  [facility] for tasks assessed/performed       History reviewed. No pertinent past medical history.  History reviewed. No pertinent surgical history.  There were no vitals filed for this visit.  Subjective Assessment - 05/17/17 0810    Subjective  "I feel like I've started to plateau".  He continues to complete HEP daily.  Otherwise no new changes.     Currently in Pain?  No/denies    Pain Score  0-No pain         [facility] PT Assessment - 05/17/17 0001      Assessment   Medical Diagnosis  R peroneal tendon subluxation and recent ankle sprain    Referring Provider  Dr. Benjamin Stainhekkekandam    Onset Date/Surgical Date  07/09/98    Next MD Visit  05/29/17       [facility] Adult PT Treatment/Exercise - 05/17/17 0001      Lumbar Exercises: Sidelying   Other Sidelying Lumbar Exercises  side plank hip dips x 8 reps each side       Cryotherapy   Number Minutes Cryotherapy  12 Minutes    Cryotherapy Location  Ankle Rt    Type of Cryotherapy  Ice pack      Ankle Exercises: Standing   SLS  Rt/Lt SLS on mini tramp with and without horiz head turns x 30 sec.     Heel Raises  10 reps 2 sets with 5# in each hand.  then 10 with knees bent.     Side Shuffle (Round Trip)  40 ft x 6 reps, slow speed to med speed.     Other Standing Ankle Exercises  single leg deadlifts with 3# weight in hand,  touching weight to ground x 10 each leg.     Other Standing Ankle Exercises  Agility:  slow 2 foot hop forward and sideways in floor ladder. wide to narrow hops in and out of ladder. Quick feet x 4 reps of ladder.   Curtsy lunges with 5# in each hand, 2 sets.       Ankle Exercises: Aerobic   Elliptical  L3: 5 min       Ankle Exercises: Stretches   Soleus Stretch  2 reps;30 seconds    Gastroc Stretch  2 reps;30 seconds    Other Stretch  slow stretch into childs pose x 15 sec x 2 reps     Other Stretch  Quad stretch R/L 15 sec x 2 reps each, hamstring stretch x 30 sec x 2 reps, 1 rep of seated piriformis stretch              PT Education - 05/17/17 0847    Education provided  Yes    Education Details  added piriformis stretch  Person(s) Educated  Patient    Methods  Explanation;Demonstration    Comprehension  Verbalized understanding          PT Long Term Goals - 05/06/17 0916      PT LONG TERM GOAL #1   Title  Pt will decrease R ankle edema by 25%    Time  5    Period  Weeks    Status  Achieved      PT LONG TERM GOAL #2   Title  Pt will decrease FOTO to 25% or less    Baseline  46%    Period  Weeks    Status  On-going      PT LONG TERM GOAL #3   Title  Pt will return to ambulation without the boot without signficant deviations    Period  Weeks    Status  On-going      PT LONG TERM GOAL #4   Title  Pt will be independent with HEP for continued ankle strength and stability    Time  5    Period  Weeks    Status  On-going            Plan - 05/17/17 16100832    Clinical Impression Statement  Pt able to tolerate light agility drills with minimal increase in symptoms. He occasionally reported a twinge of pain in lateral Rt ankle with PF and eversion; resolved each time with rest.  Continues to make good gains each visit.  Pt plans to find a lace up brace and see if it will fit in ice skate over weekend.     Rehab Potential  Excellent    PT Frequency  2x / week     PT Duration  -- 5 wks     PT Treatment/Interventions  Electrical Stimulation;Ultrasound;Patient/family education;Balance training;Neuromuscular re-education;Therapeutic exercise;Therapeutic activities;Manual techniques;Passive range of motion;Vasopneumatic Device;Taping;Dry needling    PT Next Visit Plan  Progress RLE ROM/strengthening. Progress HEP as tolerated.      Consulted and Agree with Plan of Care  Patient       Patient will benefit from skilled therapeutic intervention in order to improve the following deficits and impairments:  Abnormal gait, Pain, Increased edema, Decreased mobility, Decreased strength, Difficulty walking  Visit Diagnosis: Pain in right ankle and joints of right foot  Localized edema  Other abnormalities of gait and mobility     Problem List Patient Active Problem List   Diagnosis Date Noted  . Skin tag 05/02/2017  . Subluxation of peroneal tendon of right foot 03/20/2017   Mayer CamelJennifer Carlson-Long, PTA 05/17/17 9:10 AM  [facility] [address] [place], [place], [address] Phone: [phone]   Fax:  [phone]  Name: Manuel Young MRN: [id number] Date of Birth: [birth year]

## 2017-05-22 ENCOUNTER — Encounter: Payer: Self-pay | Admitting: Physical Therapy

## 2017-05-22 ENCOUNTER — Ambulatory Visit (INDEPENDENT_AMBULATORY_CARE_PROVIDER_SITE_OTHER): Payer: 59 | Admitting: Physical Therapy

## 2017-05-22 DIAGNOSIS — R600 Localized edema: Secondary | ICD-10-CM

## 2017-05-22 DIAGNOSIS — R2689 Other abnormalities of gait and mobility: Secondary | ICD-10-CM | POA: Diagnosis not present

## 2017-05-22 DIAGNOSIS — M25571 Pain in right ankle and joints of right foot: Secondary | ICD-10-CM

## 2017-05-22 NOTE — Therapy (Signed)
[place], [place], [address] Phone: [phone]   Fax:  [phone]  Physical Therapy Treatment  Patient Details  Name: Manuel Young MRN: [id number] Date of Birth: 12/04/1975 Referring Provider: Dr. Dianah Field   Encounter Date: 05/22/2017  PT End of Session - 05/22/17 0723    Visit Number  7    Number of Visits  10    Date for PT Re-Evaluation  05/27/17    Authorization Type  [employer]    PT Start Time  0720    PT Stop Time  0815    PT Time Calculation (min)  55 min - ice pack last 12 min    Activity Tolerance  Patient tolerated treatment well    Behavior During Therapy  [facility] for tasks assessed/performed       History reviewed. No pertinent past medical history.  History reviewed. No pertinent surgical history.  There were no vitals filed for this visit.  Subjective Assessment - 05/22/17 0724    Subjective  Pt reports he wore his aircast to watch hockey game; no issues. He bought a lace up brace but has not tried it on yet.  He is anxious to try it out and see if he can skate. He's nervous about skating, but it is his ultimate goal.     Patient Stated Goals  improve ankle strength and stability, prevent surgery     Currently in Pain?  No/denies    Pain Score  0-No pain         [facility] PT Assessment - 05/22/17 0001      Assessment   Medical Diagnosis  R peroneal tendon subluxation and recent ankle sprain    Referring Provider  Dr. Dianah Field    Onset Date/Surgical Date  07/09/98    Next MD Visit  05/29/17      Strength   Overall Strength Comments  Rt single heel raise, 0.5" lower than LLE, with slight twinge in Lateral ankle.         [facility] Adult PT Treatment/Exercise - 05/22/17 0001      Cryotherapy   Number Minutes Cryotherapy  12 Minutes    Cryotherapy Location  Ankle Rt    Type of Cryotherapy  Ice pack      Ankle Exercises: Standing   Heel Raises  5 reps BLE up, RLE down    Side Shuffle (Round Trip)  40 ft x 6 reps, slow speed to med speed.     Other Standing Ankle Exercises  Fitter with 2 black bands x 40 with light UE support, repeated without UE support.        Other Standing Ankle Exercises  Agility, 4 reps of each with floor ladder:  quick feet to front, to side. wide to narrow hops in and out of ladder. Tandem stance on 1/2 foam roller (round side down) x 30 sec x 2 reps each foot forward.   Stand to bilat high kneel x 10 each leg (onto 3" pad); single leg sit to/from stand (to black mat) x 8 reps RLE, 5 reps LLE.        Ankle Exercises: Stretches   Soleus Stretch  30 seconds;3 reps    Gastroc Stretch  30 seconds;3 reps    Other Stretch  passive Rt eversion stretch in fig 4 sitting x 10-15 sec.,     Other Stretch  Quad stretch R/L 15 sec x 2 reps each, hamstring stretch x 30 sec x 2  reps, 1 rep of seated piriformis stretch       Ankle Exercises: Aerobic   Elliptical  L3: 5 min         PT Long Term Goals - 05/22/17 0835      PT LONG TERM GOAL #1   Title  Pt will decrease R ankle edema by 25%    Time  5    Period  Weeks    Status  Achieved      PT LONG TERM GOAL #2   Title  Pt will decrease FOTO to 25% or less    Baseline  46%    Time  5    Period  Weeks    Status  On-going      PT LONG TERM GOAL #3   Title  Pt will return to ambulation without the boot without signficant deviations    Time  5    Period  Weeks    Status  Achieved      PT LONG TERM GOAL #4   Title  Pt will be independent with HEP for continued ankle strength and stability    Time  5    Period  Weeks    Status  On-going            Plan - 05/22/17 0826    Clinical Impression Statement  Pt tolerated increased speed with agility drills without difficulty, only reporting occasional twinge in Rt lateral ankle.  He was challenged with Rt single leg squat and stand to/from high kneel leading with RLE.  Added these to his existing HEP.  He plans to try to skate this weekend  with brace. Has met LTG#3 and Progressing well towards remaining goals.     Rehab Potential  Excellent    PT Frequency  2x / week    PT Duration  -- 5 wks     PT Treatment/Interventions  Electrical Stimulation;Ultrasound;Patient/family education;Balance training;Neuromuscular re-education;Therapeutic exercise;Therapeutic activities;Manual techniques;Passive range of motion;Vasopneumatic Device;Taping;Dry needling    PT Next Visit Plan  Progress RLE ROM/strengthening. Progress HEP as tolerated.  Trial of plyometrics.     Consulted and Agree with Plan of Care  Patient       Patient will benefit from skilled therapeutic intervention in order to improve the following deficits and impairments:  Abnormal gait, Pain, Increased edema, Decreased mobility, Decreased strength, Difficulty walking  Visit Diagnosis: Pain in right ankle and joints of right foot  Localized edema  Other abnormalities of gait and mobility     Problem List Patient Active Problem List   Diagnosis Date Noted  . Skin tag 05/02/2017  . Subluxation of peroneal tendon of right foot 03/20/2017   Kerin Perna, PTA 05/22/17 8:35 AM  [facility] [place], [place], [address] Phone: [phone]   Fax:  [phone]  Name: Manuel Young MRN: [id number] Date of Birth: Sep 28, 1975

## 2017-05-24 ENCOUNTER — Ambulatory Visit (INDEPENDENT_AMBULATORY_CARE_PROVIDER_SITE_OTHER): Payer: 59 | Admitting: Physical Therapy

## 2017-05-24 DIAGNOSIS — R600 Localized edema: Secondary | ICD-10-CM | POA: Diagnosis not present

## 2017-05-24 DIAGNOSIS — R2689 Other abnormalities of gait and mobility: Secondary | ICD-10-CM

## 2017-05-24 DIAGNOSIS — M25571 Pain in right ankle and joints of right foot: Secondary | ICD-10-CM

## 2017-05-24 NOTE — Therapy (Signed)
[facility] [address] [place] [address] [place], [place], [address] Phone: [phone]   Fax:  [phone]  Physical Therapy Treatment  Patient Details  Name: Manuel PelMatt Young MRN: [id number] Date of Birth: Jan 27, 1976 Referring Provider: Dr. Benjamin Stainhekkekandam   Encounter Date: 05/24/2017  PT End of Session - 05/24/17 0839    Visit Number  8    Number of Visits  10    Date for PT Re-Evaluation  05/27/17    PT Start Time  0803    PT Stop Time  0859 ice pack last 12 min     PT Time Calculation (min)  56 min       No past medical history on file.  No past surgical history on file.  There were no vitals filed for this visit.  Subjective Assessment - 05/24/17 0810    Subjective  Pt reports it has been a busy work week; has not had time to try his lace up brace on yet. He is no longer using aircast; only uses it for rigorous activities.     Patient Stated Goals  improve ankle strength and stability, prevent surgery     Currently in Pain?  No/denies    Pain Score  0-No pain         [facility] PT Assessment - 05/24/17 0001      Assessment   Medical Diagnosis  R peroneal tendon subluxation and recent ankle sprain    Referring Provider  Dr. Benjamin Stainhekkekandam    Next MD Visit  05/29/17       [facility] Adult PT Treatment/Exercise - 05/24/17 0001      Cryotherapy   Number Minutes Cryotherapy  12 Minutes    Cryotherapy Location  Ankle Rt    Type of Cryotherapy  Ice pack      Ankle Exercises: Aerobic   Elliptical  L3: 5 min      Ankle Exercises: Stretches   Soleus Stretch  30 seconds;3 reps    Gastroc Stretch  30 seconds;3 reps    Other Stretch  Quad stretch R/L 15 sec x 2 reps each, hamstring stretch x 30 sec x 2 reps, 1 rep of seated piriformis stretch       Ankle Exercises: Plyometrics   Bilateral Jumping  Box Height: 2";Box Height: 4";Box Height: 6";1 set;10 reps jump downs, then up/down, then lateral jumps down x 10 each     Plyometric  Exercises  skipping low to mid level push off x 30 ft x 4 reps (some twinges in Rt lateral ankle to push off and landing when increasing force of push off)    Plyometric Exercises  Single leg hop in T pattern x 1 rep; challenging, ankle fatigued quickly.       Ankle Exercises: Standing   SLS  Rt SLS on mini tramp with knee flexed and horiz head turns x 30 sec x 3 reps   Heel Raises  20 reps single leg, cramping in calves afterward.    Side Shuffle (Round Trip)  40 ft x 6 reps, slow speed to quick speed.     Other Standing Ankle Exercises  Curtsy lunge with back foot on paper towel (sliding in/out) x 2 reps each leg, arms holding ball outstretched in front (for HEP)          PT Long Term Goals - 05/22/17 0835      PT LONG TERM GOAL #1   Title  Pt will decrease R ankle edema by 25%  Time  5    Period  Weeks    Status  Achieved      PT LONG TERM GOAL #2   Title  Pt will decrease FOTO to 25% or less    Baseline  46%    Time  5    Period  Weeks    Status  On-going      PT LONG TERM GOAL #3   Title  Pt will return to ambulation without the boot without signficant deviations    Time  5    Period  Weeks    Status  Achieved      PT LONG TERM GOAL #4   Title  Pt will be independent with HEP for continued ankle strength and stability    Time  5    Period  Weeks    Status  On-going            Plan - 05/24/17 1221    Clinical Impression Statement  Pt's calves fatigued quickly and had some cramping with single leg exercise.  He tolerated increased speed with lateral and forward agility drills. He has had some twinges of pain with plyometric jumps (both push off and landing).  Pt has goal to return to ice skating recreationally and he is making good gains towards this and other remaining goals.     Rehab Potential  Excellent    PT Frequency  2x / week    PT Duration  -- 5 wks    PT Treatment/Interventions  Electrical Stimulation;Ultrasound;Patient/family education;Balance  training;Neuromuscular re-education;Therapeutic exercise;Therapeutic activities;Manual techniques;Passive range of motion;Vasopneumatic Device;Taping;Dry needling    PT Next Visit Plan  assess progress and readiness to d/c vs renewal of POC.  Progress HEP, continue plyometrics and skating related LE strengthening.     Consulted and Agree with Plan of Care  Patient       Patient will benefit from skilled therapeutic intervention in order to improve the following deficits and impairments:  Abnormal gait, Pain, Increased edema, Decreased mobility, Decreased strength, Difficulty walking  Visit Diagnosis: Pain in right ankle and joints of right foot  Localized edema  Other abnormalities of gait and mobility     Problem List Patient Active Problem List   Diagnosis Date Noted  . Skin tag 05/02/2017  . Subluxation of peroneal tendon of right foot 03/20/2017   Mayer CamelJennifer Carlson-Long, PTA 05/24/17 12:33 PM  [facility] [address] [place] [address] [place], [place], [address] Phone: [phone]   Fax:  [phone]  Name: Manuel Young MRN: [id number] Date of Birth: 1976/04/04

## 2017-05-28 ENCOUNTER — Ambulatory Visit (INDEPENDENT_AMBULATORY_CARE_PROVIDER_SITE_OTHER): Payer: 59 | Admitting: Physical Therapy

## 2017-05-28 DIAGNOSIS — R600 Localized edema: Secondary | ICD-10-CM

## 2017-05-28 DIAGNOSIS — R2689 Other abnormalities of gait and mobility: Secondary | ICD-10-CM | POA: Diagnosis not present

## 2017-05-28 DIAGNOSIS — M25571 Pain in right ankle and joints of right foot: Secondary | ICD-10-CM

## 2017-05-28 NOTE — Therapy (Addendum)
[place], [place], [address] Phone: [phone]   Fax:  [phone]  Physical Therapy Treatment  Patient Details  Name: Manuel Young MRN: [id number] Date of Birth: Jul 20, 1975 Referring Provider: Dr. Dianah Field   Encounter Date: 05/28/2017  PT End of Session - 05/28/17 0809    Visit Number  9    Number of Visits  10    Date for PT Re-Evaluation  05/27/17    Authorization Type  [employer]    Authorization - Number of Visits  20    PT Start Time  0806    PT Stop Time  0855 ice pack last 10 min    PT Time Calculation (min)  49 min    Activity Tolerance  Patient tolerated treatment well    Behavior During Therapy  [facility] for tasks assessed/performed       No past medical history on file.  No past surgical history on file.  There were no vitals filed for this visit.  Subjective Assessment - 05/28/17 0812    Subjective  Manuel Young reports he has been having more frequent episodes of sharp pain (up to 6/10) in Rt ant knee with certain twisting motions.  He tried his lace up brace on and it fits in skate.  He is anxious to return to hockey.  Pt returns to MD tomorrow.      Patient Stated Goals  improve ankle strength and stability, prevent surgery     Currently in Pain?  No/denies up to 6/10 in Rt ant knee         [facility] PT Assessment - 05/28/17 0001      Assessment   Medical Diagnosis  R peroneal tendon subluxation and recent ankle sprain    Referring Provider  Dr. Dianah Field    Onset Date/Surgical Date  07/09/98    Next MD Visit  05/29/17      Observation/Other Assessments   Focus on Therapeutic Outcomes (FOTO)   23% limited      High Level Balance   High Level Balance Comments  3 single leg hop test: LLE - 120 in, 126 in; RLE 107, 126 in. (with incidence of pain on 3rd hop)       [facility] Adult PT Treatment/Exercise - 05/28/17 0001      Cryotherapy   Number Minutes Cryotherapy  10 Minutes    Cryotherapy Location  Knee;Ankle Rt    Type of Cryotherapy  Ice pack      Ankle Exercises: Standing   SLS  Rt/Lt SLS on fitter with single leg abd with foot on fitter (black /blue band) x 25 each leg. Bounding Rt/Lt x 25.    Side Shuffle (Round Trip)  40 ft x 6 reps, slow speed to quick speed.     Other Standing Ankle Exercises  Fitter with 2 black bands x 3 min, light to no UE support.   Hop scotch x 25 ft x 4 reps, Skipping x 40 ft,  4 reps;   Single leg hop in X pattern x 10 reps each leg.      Ankle Exercises: Aerobic   Elliptical  L3: 5 min      Ankle Exercises: Stretches   Soleus Stretch  30 seconds;3 reps    Gastroc Stretch  30 seconds;3 reps    Other Stretch  Quad stretch R/L 15 sec x 2 reps each, hamstring stretch x 30 sec x 2 reps, 1 rep of seated piriformis stretch  PT Long Term Goals - 05/28/17 1132      PT LONG TERM GOAL #1   Title  Pt will decrease R ankle edema by 25%    Time  5    Period  Weeks    Status  Achieved      PT LONG TERM GOAL #2   Title  Pt will decrease FOTO to 25% or less    Baseline  46%    Period  Weeks    Status  Achieved 23% limited      PT LONG TERM GOAL #3   Title  Pt will return to ambulation without the boot without signficant deviations    Time  5    Period  Weeks    Status  Achieved      PT LONG TERM GOAL #4   Title  Pt will be independent with HEP for continued ankle strength and stability    Time  5    Period  Weeks    Status  On-going            Plan - 05/28/17 1133    Clinical Impression Statement  Manuel Young tolerated single leg hops with less episodes of twinges of Rt ankle pain.  He has new pain in his Rt knee with any twisting motion; will be assessed by MD tomorrow.  His FOTO score improved to 23% limited; has met LTG# 2. Pt has improved his strength and ROM of Rt ankle and wishes to return to hockey; has met most of his therapy goals.     Rehab Potential  Excellent    PT Frequency  2x / week    PT Duration  -- 5  wks    PT Treatment/Interventions  Electrical Stimulation;Ultrasound;Patient/family education;Balance training;Neuromuscular re-education;Therapeutic exercise;Therapeutic activities;Manual techniques;Passive range of motion;Vasopneumatic Device;Taping;Dry needling    PT Next Visit Plan  will await further recommendations from MD (d/c vs additional visits to prepare to return to skating).      Consulted and Agree with Plan of Care  Patient       Patient will benefit from skilled therapeutic intervention in order to improve the following deficits and impairments:  Abnormal gait, Pain, Increased edema, Decreased mobility, Decreased strength, Difficulty walking  Visit Diagnosis: Pain in right ankle and joints of right foot  Localized edema  Other abnormalities of gait and mobility     Problem List Patient Active Problem List   Diagnosis Date Noted  . Skin tag 05/02/2017  . 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  Patient goals were partially met. Patient is being discharged due to being pleased with the current functional level.  ?????    Jeral Pinch, PT 08/05/17 8:37 AM

## 2017-05-29 ENCOUNTER — Encounter: Payer: Self-pay | Admitting: Sports Medicine

## 2017-05-29 ENCOUNTER — Ambulatory Visit (INDEPENDENT_AMBULATORY_CARE_PROVIDER_SITE_OTHER): Payer: 59

## 2017-05-29 ENCOUNTER — Ambulatory Visit (INDEPENDENT_AMBULATORY_CARE_PROVIDER_SITE_OTHER): Payer: 59 | Admitting: Sports Medicine

## 2017-05-29 DIAGNOSIS — S9304XS Dislocation of right ankle joint, sequela: Secondary | ICD-10-CM

## 2017-05-29 DIAGNOSIS — M25561 Pain in right knee: Secondary | ICD-10-CM

## 2017-05-29 DIAGNOSIS — M1711 Unilateral primary osteoarthritis, right knee: Secondary | ICD-10-CM | POA: Insufficient documentation

## 2017-05-29 DIAGNOSIS — IMO0001 Reserved for inherently not codable concepts without codable children: Secondary | ICD-10-CM

## 2017-05-29 MED ORDER — IBUPROFEN 800 MG PO TABS: 800.0000 mg | ORAL_TABLET | Freq: Three times a day (TID) | ORAL | 2 refills | Status: AC | PRN

## 2017-05-29 NOTE — Assessment & Plan Note (Signed)
Suspect early meniscal injury. Rehab exercises, he will do 800 mg of meloxicam, x-rays. Return in 2 weeks, injection and MRI if no better. Deductible is met.

## 2017-05-29 NOTE — Progress Notes (Signed)
   Subjective:    I'm seeing this patient as a consultation for: Dr. Floria Raveling  CC: Right knee pain  HPI: For the past several weeks this pleasant 41 year old male has had pain that he localizes along the medial joint line of occasional catches, with sharp pains.  Minimal swelling.  Moderate, persistent without radiation.  Right peroneal subluxation: After 1-2 months of immobilization in a boot, cast, as well as aggressive physical therapy, as well as aspiration and injection of the peroneal tendon sheath he feels approximately 85% better.  Past medical history, Surgical history, Family history not pertinant except as noted below, Social history, Allergies, and medications have been entered into the medical record, reviewed, and no changes needed.   Review of Systems: No headache, visual changes, nausea, vomiting, diarrhea, constipation, dizziness, abdominal pain, skin rash, fevers, chills, night sweats, weight loss, swollen lymph nodes, body aches, joint swelling, muscle aches, chest pain, shortness of breath, mood changes, visual or auditory hallucinations.   Objective:   General: Well Developed, well nourished, and in no acute distress.  Neuro:  Extra-ocular muscles intact, able to move all 4 extremities, sensation grossly intact.  Deep tendon reflexes tested were normal. Psych: Alert and oriented, mood congruent with affect. ENT:  Ears and nose appear unremarkable.  Hearing grossly normal. Neck: Unremarkable overall appearance, trachea midline.  No visible thyroid enlargement. Eyes: Conjunctivae and lids appear unremarkable.  Pupils equal and round. Skin: Warm and dry, no rashes noted.  Cardiovascular: Pulses palpable, no extremity edema. Right knee: Minimally swollen, mild fluid wave, tenderness at the medial joint line ROM normal in flexion and extension and lower leg rotation. Ligaments with solid consistent endpoints including ACL, PCL, LCL, MCL. Negative Mcmurray's tests.   Positive Thessaly sign. Non painful patellar compression. Patellar and quadriceps tendons unremarkable. Hamstring and quadriceps strength is normal.  Impression and Recommendations:   This case required medical decision making of moderate complexity.  Right knee pain Suspect early meniscal injury. Rehab exercises, he will do 800 mg of meloxicam, x-rays. Return in 2 weeks, injection and MRI if no better. Deductible is met.  Subluxation of peroneal tendon of right foot Continues to improve, 85% better with physical therapy. Continue ASO when ice skating, and stirrup Aircast, I will see this again in about a month. Transition to home rehab exercises.  ___________________________________________ Gwen Her. Dianah Field, M.D., ABFM., CAQSM. Primary Care and [place] Instructor of [place] [employer] [facility] [employer] of Medicine

## 2017-05-29 NOTE — Assessment & Plan Note (Signed)
Continues to improve, 85% better with physical therapy. Continue ASO when ice skating, and stirrup Aircast, I will see this again in about a month. Transition to home rehab exercises.

## 2017-06-10 ENCOUNTER — Ambulatory Visit (INDEPENDENT_AMBULATORY_CARE_PROVIDER_SITE_OTHER): Payer: 59

## 2017-06-10 ENCOUNTER — Encounter: Payer: Self-pay | Admitting: Sports Medicine

## 2017-06-10 ENCOUNTER — Ambulatory Visit (INDEPENDENT_AMBULATORY_CARE_PROVIDER_SITE_OTHER): Payer: 59 | Admitting: Sports Medicine

## 2017-06-10 DIAGNOSIS — Z23 Encounter for immunization: Secondary | ICD-10-CM | POA: Diagnosis not present

## 2017-06-10 DIAGNOSIS — M25561 Pain in right knee: Secondary | ICD-10-CM

## 2017-06-10 NOTE — Assessment & Plan Note (Addendum)
Suspect acute meniscal tear, mechanical symptoms present. X-rays unrevealing, stat MRI. Ultrasound guided injection as above. Return in 1 month.

## 2017-06-10 NOTE — Progress Notes (Signed)
  Subjective:    CC: Right knee pain  HPI: This is a pleasant 41 year old male, he has had knee pain for some time now, we have put him through some rehab exercises, x-rays were unrevealing, pain is at the medial joint line, he does get catching and locking.  Tried to play some hockey this weekend, severe catching, pain, locking.  Past medical history:  Negative.  See flowsheet/record as well for more information.  Surgical history: Negative.  See flowsheet/record as well for more information.  Family history: Negative.  See flowsheet/record as well for more information.  Social history: Negative.  See flowsheet/record as well for more information.  Allergies, and medications have been entered into the medical record, reviewed, and no changes needed.   Review of Systems: No fevers, chills, night sweats, weight loss, chest pain, or shortness of breath.   Objective:    General: Well Developed, well nourished, and in no acute distress.  Neuro: Alert and oriented x3, extra-ocular muscles intact, sensation grossly intact.  HEENT: Normocephalic, atraumatic, pupils equal round reactive to light, neck supple, no masses, no lymphadenopathy, thyroid nonpalpable.  Skin: Warm and dry, no rashes. Cardiac: Regular rate and rhythm, no murmurs rubs or gallops, no lower extremity edema.  Respiratory: Clear to auscultation bilaterally. Not using accessory muscles, speaking in full sentences. Right knee: Visibly swollen, palpable effusion, tender to palpation at the medial joint line ROM normal in flexion and extension and lower leg rotation. Ligaments with solid consistent endpoints including ACL, PCL, LCL, MCL. Positive McMurray sign, positive Apley's test, positive Thessaly test Non painful patellar compression. Patellar and quadriceps tendons unremarkable. Hamstring and quadriceps strength is normal.  Procedure: Real-time Ultrasound Guided Injection of right knee Device: GE Logiq E  Verbal  informed consent obtained.  Time-out conducted.  Noted no overlying erythema, induration, or other signs of local infection.  Skin prepped in a sterile fashion.  Local anesthesia: Topical Ethyl chloride.  With sterile technique and under real time ultrasound guidance: 1 cc kenalog 40, 2 cc lidocaine, 2 cc bupivacaine injected easily Completed without difficulty  Pain immediately resolved suggesting accurate placement of the medication.  Advised to call if fevers/chills, erythema, induration, drainage, or persistent bleeding.  Images permanently stored and available for review in the ultrasound unit.  Impression: Technically successful ultrasound guided injection.  Impression and Recommendations:    Right knee pain Suspect acute meniscal tear, mechanical symptoms present. X-rays unrevealing, stat MRI. Ultrasound guided injection as above. Return in 1 month.  ___________________________________________ Ihor Austinhomas J. Benjamin Stainhekkekandam, M.D., ABFM., CAQSM. Primary Care and Sports Medicine [facility]  Adjunct Instructor of Family Medicine  [employer]

## 2017-07-16 ENCOUNTER — Encounter: Payer: Self-pay | Admitting: Sports Medicine

## 2017-07-16 ENCOUNTER — Ambulatory Visit (INDEPENDENT_AMBULATORY_CARE_PROVIDER_SITE_OTHER): Payer: 59 | Admitting: Sports Medicine

## 2017-07-16 DIAGNOSIS — M1711 Unilateral primary osteoarthritis, right knee: Secondary | ICD-10-CM | POA: Diagnosis not present

## 2017-07-16 NOTE — Progress Notes (Signed)
  Subjective:    CC: Recheck knee  HPI: This is a pleasant 42 year old male, he had some medial joint line knee pain, we suspected a meniscal tear, injected his knee and ordered an MRI.  The MRI showed no evidence of meniscal tear but simply chondral fissuring and osteoarthritis.  He has had a complete resolution of discomfort after the injection and is able to skate and play hockey without an issue.  I reviewed the past medical history, family history, social history, surgical history, and allergies today and no changes were needed.  Please see the problem list section below in epic for further details.  Past Medical History: No past medical history on file. Past Surgical History: No past surgical history on file. Social History: Social History   Socioeconomic History  . Marital status: Married    Spouse name: None  . Number of children: None  . Years of education: None  . Highest education level: None  Social Needs  . Financial resource strain: None  . Food insecurity - worry: None  . Food insecurity - inability: None  . Transportation needs - medical: None  . Transportation needs - non-medical: None  Occupational History  . None  Tobacco Use  . Smoking status: Never Smoker  . Smokeless tobacco: Never Used  Substance and Sexual Activity  . Alcohol use: None  . Drug use: None  . Sexual activity: None  Other Topics Concern  . None  Social History Narrative  . None   Family History: No family history on file. Allergies: No Known Allergies Medications: See med rec.  Review of Systems: No fevers, chills, night sweats, weight loss, chest pain, or shortness of breath.   Objective:    General: Well Developed, well nourished, and in no acute distress.  Neuro: Alert and oriented x3, extra-ocular muscles intact, sensation grossly intact.  HEENT: Normocephalic, atraumatic, pupils equal round reactive to light, neck supple, no masses, no lymphadenopathy, thyroid  nonpalpable.  Skin: Warm and dry, no rashes. Cardiac: Regular rate and rhythm, no murmurs rubs or gallops, no lower extremity edema.  Respiratory: Clear to auscultation bilaterally. Not using accessory muscles, speaking in full sentences.  Impression and Recommendations:    Primary osteoarthritis of right knee Doing much better after injection. No further complaints. Return as needed. ___________________________________________ Ihor Austinhomas J. Benjamin Stainhekkekandam, M.D., ABFM., CAQSM. Primary Care and Sports Medicine [facility]  Adjunct Instructor of Family Medicine  [employer]

## 2017-07-16 NOTE — Assessment & Plan Note (Signed)
Doing much better after injection. No further complaints. Return as needed.

## 2017-09-08 ENCOUNTER — Encounter: Payer: Self-pay | Admitting: Emergency Medicine

## 2017-09-08 ENCOUNTER — Emergency Department (INDEPENDENT_AMBULATORY_CARE_PROVIDER_SITE_OTHER)
Admission: EM | Admit: 2017-09-08 | Discharge: 2017-09-08 | Disposition: A | Discharge status: Home / Self Care | Payer: 59 | Source: Home / Self Care | Attending: Family Medicine | Admitting: Family Medicine

## 2017-09-08 ENCOUNTER — Other Ambulatory Visit: Payer: Self-pay

## 2017-09-08 DIAGNOSIS — J029 Acute pharyngitis, unspecified: Secondary | ICD-10-CM

## 2017-09-08 LAB — POCT RAPID STREP A (OFFICE): RAPID STREP A SCREEN: NEGATIVE

## 2017-09-08 MED ORDER — CLINDAMYCIN HCL 300 MG PO CAPS: ORAL_CAPSULE | ORAL | 0 refills | Status: AC

## 2017-09-08 NOTE — ED Provider Notes (Signed)
[facility]    CSN: [id number] Arrival date & time: 09/08/17  1424     History   Chief Complaint Chief Complaint  Patient presents with  . Sore Throat    HPI Manuel Young is a 42 y.o. male.   Patient complains of sore throat for two days.  He also has been fatigued with chills/sweats and cough.  He was treated for strep pharyngitis two weeks ago with Augmentin.  His daughter currently has strep pharyngitis, and he fears he has a recurrent strep throat.   The history is provided by the patient.    History reviewed. No pertinent past medical history.  Patient Active Problem List   Diagnosis Date Noted  . Primary osteoarthritis of right knee 05/29/2017  . Skin tag 05/02/2017  . Subluxation of peroneal tendon of right foot 03/20/2017    History reviewed. No pertinent surgical history.     Home Medications    Prior to Admission medications   Medication Sig Start Date End Date Taking? Authorizing Provider  clindamycin (CLEOCIN) 300 MG capsule Take one cap by mouth every 8 hours for 10 days (Rx void after 09/15/17) 09/08/17   Lattie Haw, MD  ibuprofen (ADVIL,MOTRIN) 800 MG tablet Take 1 tablet (800 mg total) by mouth every 8 (eight) hours as needed. 05/29/17   Monica Becton, MD  omeprazole (PRILOSEC) 20 MG capsule Take 20 mg by mouth daily.    [provider]    Family History History reviewed. No pertinent family history.  Social History Social History   Tobacco Use  . Smoking status: Never Smoker  . Smokeless tobacco: Never Used  Substance Use Topics  . Alcohol use: Not on file  . Drug use: Not on file     Allergies   Patient has no known allergies.   Review of Systems Review of Systems + sore throat + cough No pleuritic pain No wheezing + nasal congestion + post-nasal drainage No sinus pain/pressure No itchy/red eyes No earache No hemoptysis No SOB ? fever, + chills/sweats No nausea No vomiting No abdominal  pain No diarrhea No urinary symptoms No skin rash + fatigue No myalgias No headache Used OTC meds without relief   Physical Exam Triage Vital Signs ED Triage Vitals  Enc Vitals Group     BP 09/08/17 1445 (!) 143/87     Pulse Rate 09/08/17 1445 70     Resp 09/08/17 1445 16     Temp 09/08/17 1445 97.9 F (36.6 C)     Temp Source 09/08/17 1445 Oral     SpO2 09/08/17 1445 97 %     Weight 09/08/17 1446 220 lb (99.8 kg)     Height 09/08/17 1446 5\' 8"  (1.727 m)     Head Circumference --      Peak Flow --      Pain Score --      Pain Loc --      Pain Edu? --      Excl. in GC? --    No data found.  Updated Vital Signs BP (!) 143/87 (BP Location: Right Arm)   Pulse 70   Temp 97.9 F (36.6 C) (Oral)   Resp 16   Ht 5\' 8"  (1.727 m)   Wt 220 lb (99.8 kg)   SpO2 97%   BMI 33.45 kg/m   Visual Acuity Right Eye Distance:   Left Eye Distance:   Bilateral Distance:    Right Eye Near:   Left  Eye Near:    Bilateral Near:     Physical Exam Nursing notes and Vital Signs reviewed. Appearance:  Patient appears stated age, and in no acute distress Eyes:  Pupils are equal, round, and reactive to light and accomodation.  Extraocular movement is intact.  Conjunctivae are not inflamed  Ears:  Canals normal.  Tympanic membranes normal.  Nose:  Mildly congested turbinates.  No sinus tenderness.  Pharynx:  Erythematous uvula. Neck:  Supple.  Enlarged posterior/lateral nodes are palpated bilaterally, tender to palpation on the left.   Lungs:  Clear to auscultation.  Breath sounds are equal.  Moving air well. Heart:  Regular rate and rhythm without murmurs, rubs, or gallops.  Abdomen:  Nontender without masses or hepatosplenomegaly.  Bowel sounds are present.  No CVA or flank tenderness.  Extremities:  No edema.  Skin:  No rash present.    UC Treatments / Results  Labs (all labs ordered are listed, but only abnormal results are displayed) Labs Reviewed  POCT RAPID STREP A (OFFICE)  negative    EKG  EKG Interpretation None       Radiology No results found.  Procedures Procedures (including critical care time)  Medications Ordered in UC Medications - No data to display   Initial Impression / Assessment and Plan / UC Course  I have reviewed the triage vital signs and the nursing notes.  Pertinent labs & imaging results that were available during my care of the patient were reviewed by me and considered in my medical decision making (see chart for details).    CENTOR 0.  Suspect new early viral URI. Throat culture pending. If throat culture is positive, begin taking Clindamycin (given Rx to hold).  If increasing cold-like symptoms develop, try the following: Take plain guaifenesin (1200mg  extended release tabs such as Mucinex) twice daily, with plenty of water, for cough and congestion.  May add Pseudoephedrine (30mg , one or two every 4 to 6 hours) for sinus congestion.  Get adequate rest.   May use Afrin nasal spray (or generic oxymetazoline) each morning for about 5 days and then discontinue.  Also recommend using saline nasal spray several times daily and saline nasal irrigation (AYR is a common brand).  Use Flonase nasal spray each morning after using Afrin nasal spray and saline nasal irrigation. Try warm salt water gargles for sore throat.  Stop all antihistamines for now, and other non-prescription cough/cold preparations. May take Ibuprofen 200mg , 4 tabs every 8 hours with food for sore throat, body aches, etc. May take Delsym Cough Suppressant at bedtime for nighttime cough.  Followup with Family Doctor if not improved in about 8 days.    Final Clinical Impressions(s) / UC Diagnoses   Final diagnoses:  Acute pharyngitis, unspecified etiology    ED Discharge Orders        Ordered    clindamycin (CLEOCIN) 300 MG capsule     09/08/17 1542          Lattie HawBeese, Stephen A, MD 09/14/17 1717

## 2017-09-08 NOTE — ED Triage Notes (Signed)
Patient had positive Strep throat test and was treated 2 weeks ago and feels sore throat indicates it may have returned. His daughter currently has Strep throat.

## 2017-09-08 NOTE — Discharge Instructions (Signed)
If throat culture is positive, begin taking Clindamycin.  If increasing cold-like symptoms develop, try the following: Take plain guaifenesin (1200mg  extended release tabs such as Mucinex) twice daily, with plenty of water, for cough and congestion.  May add Pseudoephedrine (30mg , one or two every 4 to 6 hours) for sinus congestion.  Get adequate rest.   May use Afrin nasal spray (or generic oxymetazoline) each morning for about 5 days and then discontinue.  Also recommend using saline nasal spray several times daily and saline nasal irrigation (AYR is a common brand).  Use Flonase nasal spray each morning after using Afrin nasal spray and saline nasal irrigation. Try warm salt water gargles for sore throat.  Stop all antihistamines for now, and other non-prescription cough/cold preparations. May take Ibuprofen 200mg , 4 tabs every 8 hours with food for sore throat, body aches, etc. May take Delsym Cough Suppressant at bedtime for nighttime cough.

## 2017-09-10 ENCOUNTER — Telehealth: Payer: Self-pay

## 2017-09-10 LAB — STREP A DNA PROBE: GROUP A STREP PROBE: NOT DETECTED

## 2018-07-19 IMAGING — DX DG KNEE 1-2V*L*
4 series · 4 of 4 positions shown · non-contrast
Comparison: None

CLINICAL DATA: Heard a pop in his RIGHT knee while walking 2 months
ago, hurting consistently for 2 weeks, pain medially

EXAM:
RIGHT KNEE - COMPLETE 4+ VIEW; LEFT KNEE - 1-2 VIEW

[knee lat]
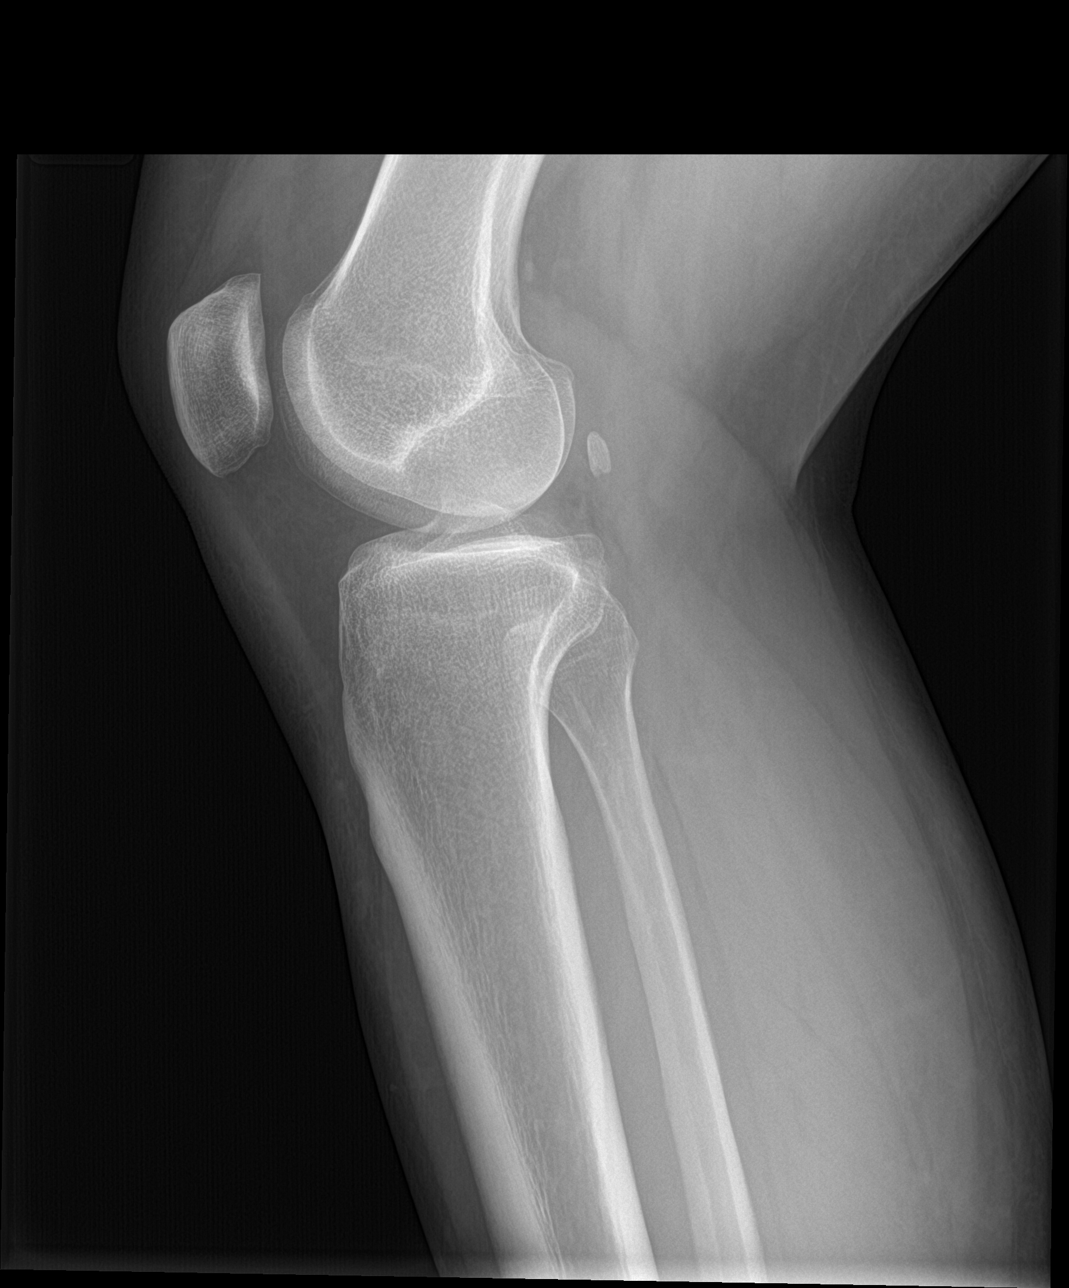

[knee sunrise]
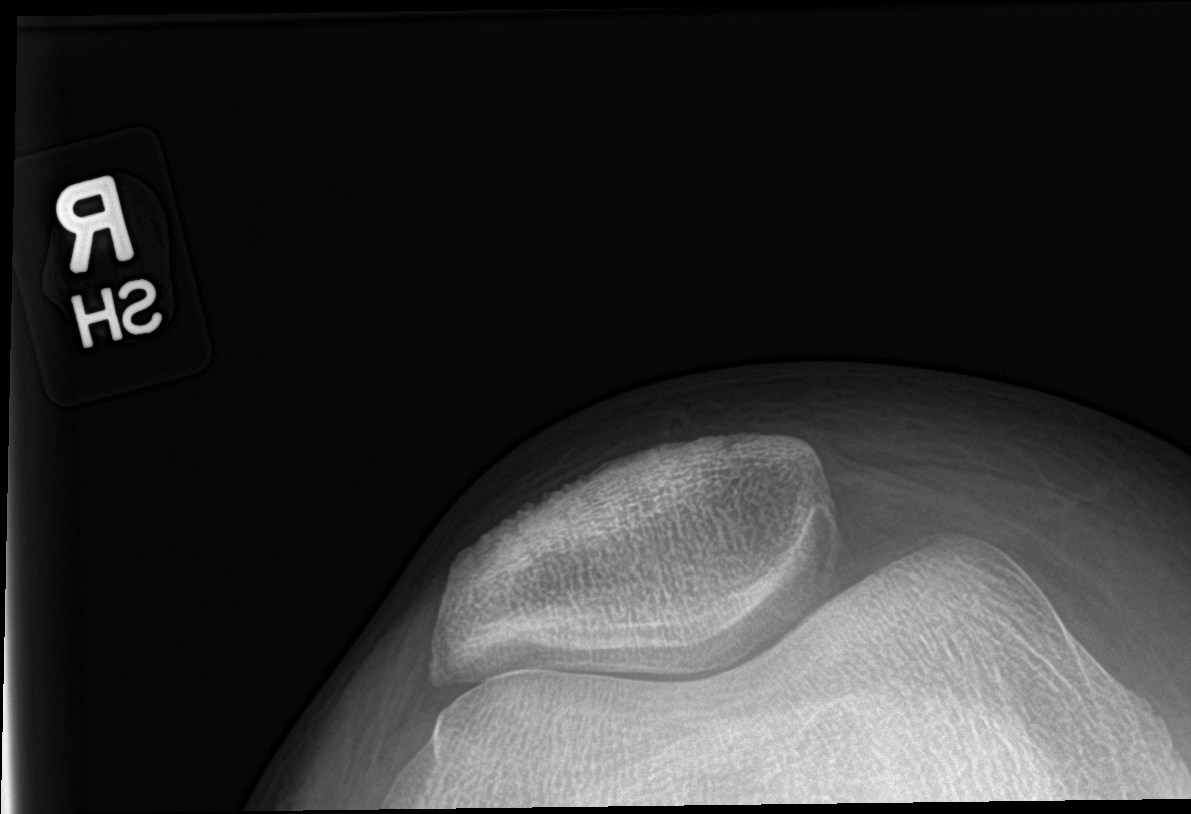

[knee ap bilat standing (1 of 2)]
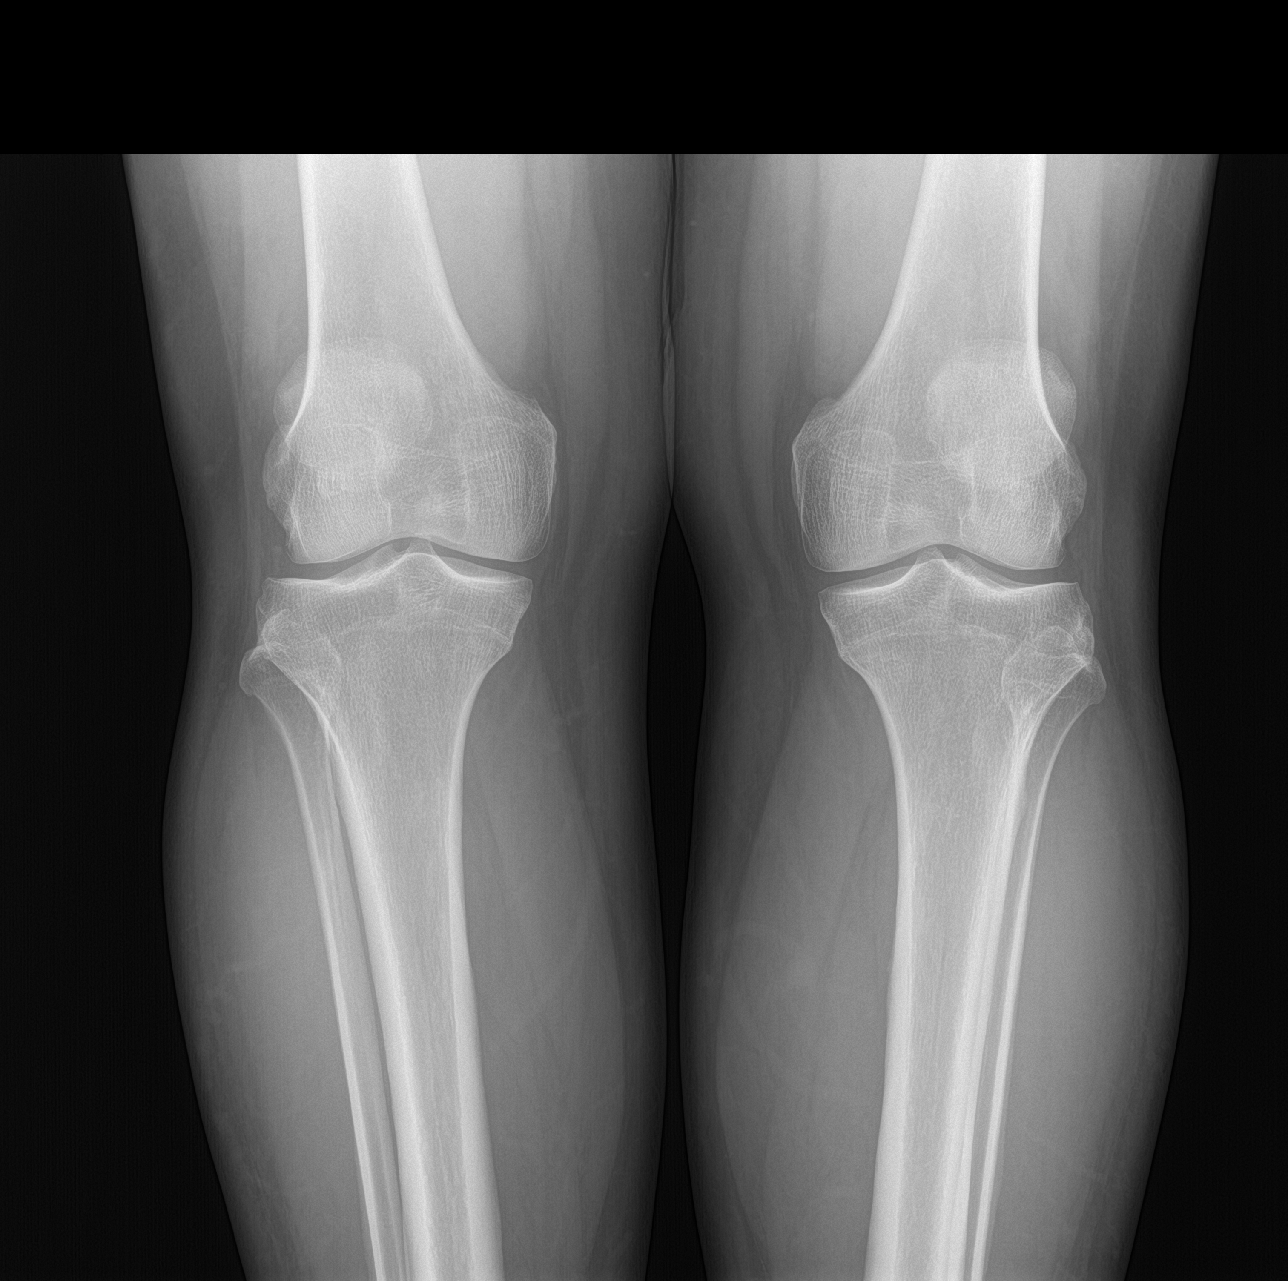

[knee ap bilat standing (2 of 2)]
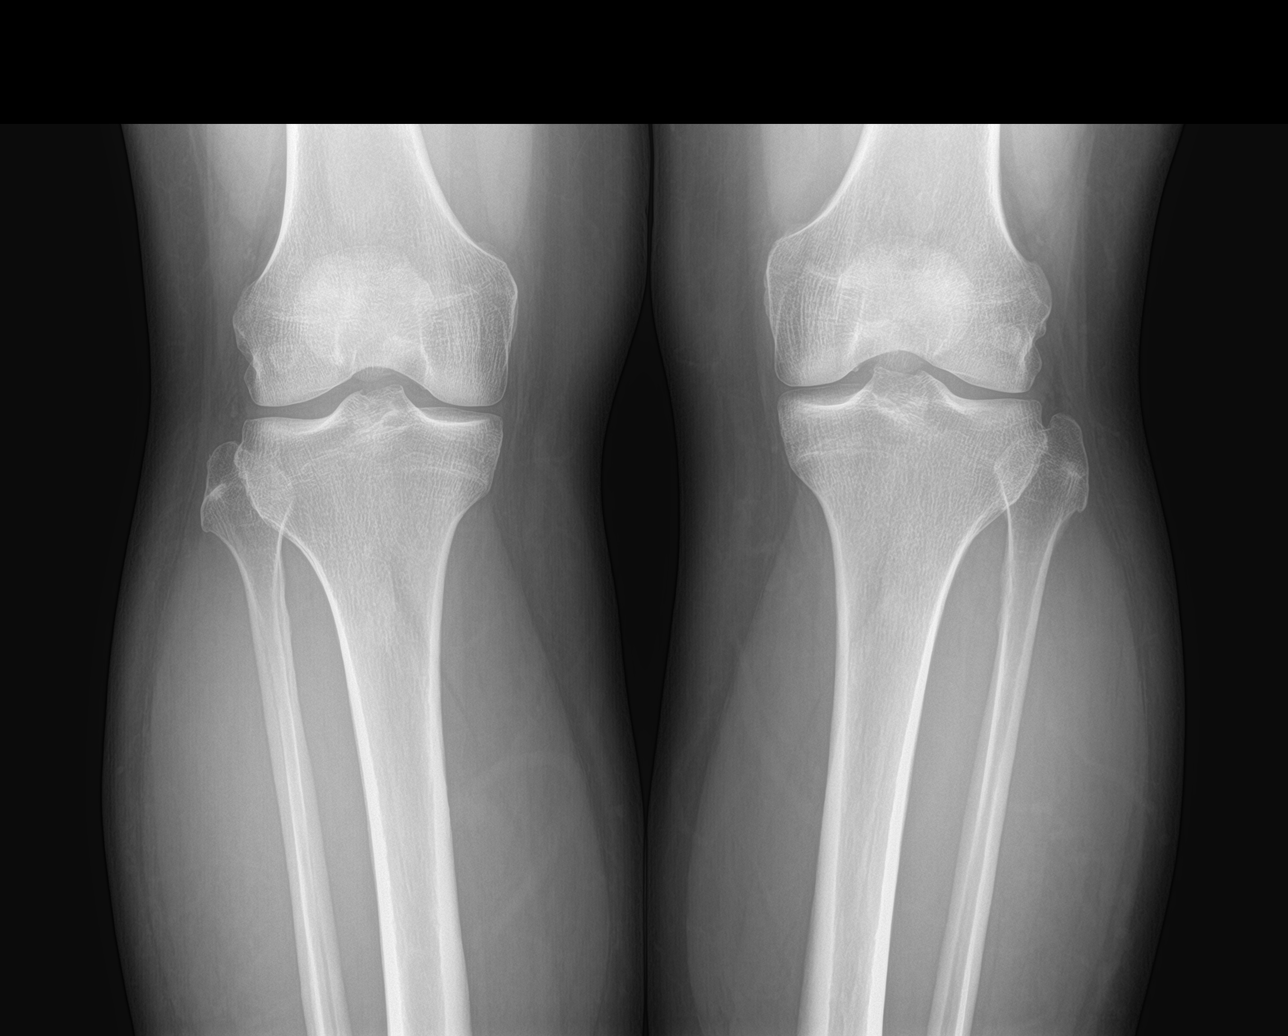

[4 of 4 positions shown; findings below may reference images not displayed]

FINDINGS: RIGHT knee:

Osseous mineralization normal.

Joint spaces preserved.

No acute fracture, dislocation, or bone destruction.

No knee joint effusion.

LEFT knee:

Osseous mineralization normal.

Joint spaces preserved.

No fracture, dislocation or bone destruction identified on AP exam.
IMPRESSION: No acute osseous abnormalities.
